# Patient Record
Sex: Male | Born: 1965 | Race: White | Marital: Married | Smoking: Former smoker
Health system: Southern US, Community
[De-identification: ages and names within clinical notes are randomized; demographics above are authoritative.]

## PROBLEM LIST (undated history)

## (undated) DIAGNOSIS — I10 Essential (primary) hypertension: Secondary | ICD-10-CM

## (undated) DIAGNOSIS — K37 Unspecified appendicitis: Secondary | ICD-10-CM

## (undated) DIAGNOSIS — E785 Hyperlipidemia, unspecified: Secondary | ICD-10-CM

## (undated) DIAGNOSIS — E119 Type 2 diabetes mellitus without complications: Secondary | ICD-10-CM

## (undated) HISTORY — PX: VASECTOMY: SHX75

## (undated) HISTORY — DX: Essential (primary) hypertension: I10

## (undated) HISTORY — DX: Type 2 diabetes mellitus without complications: E11.9

## (undated) HISTORY — DX: Unspecified appendicitis: K37

## (undated) HISTORY — DX: Hyperlipidemia, unspecified: E78.5

## (undated) HISTORY — PX: WISDOM TOOTH EXTRACTION: SHX21

---
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
## 2004-10-06 DIAGNOSIS — E119 Type 2 diabetes mellitus without complications: Secondary | ICD-10-CM

## 2004-10-06 HISTORY — DX: Type 2 diabetes mellitus without complications: E11.9

## 2014-04-24 ENCOUNTER — Ambulatory Visit: Payer: BC Managed Care – PPO | Admitting: Neurology

## 2014-11-07 ENCOUNTER — Ambulatory Visit (INDEPENDENT_AMBULATORY_CARE_PROVIDER_SITE_OTHER): Payer: BLUE CROSS/BLUE SHIELD | Admitting: Physician Assistant

## 2014-11-07 ENCOUNTER — Encounter: Payer: Self-pay | Admitting: Physician Assistant

## 2014-11-07 VITALS — BP 160/104 | HR 71 | Temp 97.7°F | Resp 16 | Ht 70.0 in | Wt 202.0 lb

## 2014-11-07 DIAGNOSIS — I10 Essential (primary) hypertension: Secondary | ICD-10-CM

## 2014-11-07 DIAGNOSIS — IMO0002 Reserved for concepts with insufficient information to code with codable children: Secondary | ICD-10-CM

## 2014-11-07 DIAGNOSIS — E1165 Type 2 diabetes mellitus with hyperglycemia: Secondary | ICD-10-CM

## 2014-11-07 LAB — LIPID PANEL
Cholesterol: 198 mg/dL (ref 0–200)
HDL: 37.5 mg/dL — AB (ref >39.00)
NONHDL: 160.5
TRIGLYCERIDES: 335 mg/dL — AB (ref 0.0–149.0)
Total CHOL/HDL Ratio: 5
VLDL: 67 mg/dL — AB (ref 0.0–40.0)

## 2014-11-07 LAB — BASIC METABOLIC PANEL
BUN: 11 mg/dL (ref 6–23)
CHLORIDE: 100 meq/L (ref 96–112)
CO2: 29 mEq/L (ref 19–32)
Calcium: 9.5 mg/dL (ref 8.4–10.5)
Creatinine, Ser: 0.9 mg/dL (ref 0.40–1.50)
GFR: 95.79 mL/min (ref >60.00)
Glucose, Bld: 232 mg/dL — ABNORMAL HIGH (ref 70–99)
Potassium: 4 mEq/L (ref 3.5–5.1)
Sodium: 136 mEq/L (ref 135–145)

## 2014-11-07 LAB — HEMOGLOBIN A1C: Hgb A1c MFr Bld: 9.2 % — ABNORMAL HIGH (ref 4.6–6.5)

## 2014-11-07 LAB — LDL CHOLESTEROL, DIRECT: Direct LDL: 117 mg/dL

## 2014-11-07 MED ORDER — LISINOPRIL 20 MG PO TABS: 20.0000 mg | ORAL_TABLET | Freq: Every day | ORAL | Status: DC

## 2014-11-07 MED ORDER — SITAGLIPTIN PHOSPHATE 100 MG PO TABS: 100.0000 mg | ORAL_TABLET | Freq: Every day | ORAL | Status: DC

## 2014-11-07 MED ORDER — METFORMIN HCL 1000 MG PO TABS: 1000.0000 mg | ORAL_TABLET | Freq: Two times a day (BID) | ORAL | Status: DC

## 2014-11-07 NOTE — Progress Notes (Signed)
Pre visit review using our clinic review tool, if applicable. No additional management support is needed unless otherwise documented below in the visit note/SLS  

## 2014-11-07 NOTE — Patient Instructions (Signed)
Please go to the lab for blood work.  I will call you with your results.  Resume all medications, including the Lisinopril. We will alter your diabetes regimen based on your results.  You will be contacted by the [facility] for an appointment.   Stay active!  Watch those carbs!  Follow-up with me in 3 months.  Diabetes and Foot Care Diabetes may cause you to have problems because of poor blood supply (circulation) to your feet and legs. This may cause the skin on your feet to become thinner, break easier, and heal more slowly. Your skin may become dry, and the skin may peel and crack. You may also have nerve damage in your legs and feet causing decreased feeling in them. You may not notice minor injuries to your feet that could lead to infections or more serious problems. Taking care of your feet is one of the most important things you can do for yourself.  HOME CARE INSTRUCTIONS  Wear shoes at all times, even in the house. Do not go barefoot. Bare feet are easily injured.  Check your feet daily for blisters, cuts, and redness. If you cannot see the bottom of your feet, use a mirror or ask someone for help.  Wash your feet with warm water (do not use hot water) and mild soap. Then pat your feet and the areas between your toes until they are completely dry. Do not soak your feet as this can dry your skin.  Apply a moisturizing lotion or petroleum jelly (that does not contain alcohol and is unscented) to the skin on your feet and to dry, brittle toenails. Do not apply lotion between your toes.  Trim your toenails straight across. Do not dig under them or around the cuticle. File the edges of your nails with an emery board or nail file.  Do not cut corns or calluses or try to remove them with medicine.  Wear clean socks or stockings every day. Make sure they are not too tight. Do not wear knee-high stockings since they may decrease blood flow to your legs.  Wear shoes that fit properly  and have enough cushioning. To break in new shoes, wear them for just a few hours a day. This prevents you from injuring your feet. Always look in your shoes before you put them on to be sure there are no objects inside.  Do not cross your legs. This may decrease the blood flow to your feet.  If you find a minor scrape, cut, or break in the skin on your feet, keep it and the skin around it clean and dry. These areas may be cleansed with mild soap and water. Do not cleanse the area with peroxide, alcohol, or iodine.  When you remove an adhesive bandage, be sure not to damage the skin around it.  If you have a wound, look at it several times a day to make sure it is healing.  Do not use heating pads or hot water bottles. They may burn your skin. If you have lost feeling in your feet or legs, you may not know it is happening until it is too late.  Make sure your health care provider performs a complete foot exam at least annually or more often if you have foot problems. Report any cuts, sores, or bruises to your health care provider immediately. SEEK MEDICAL CARE IF:   You have an injury that is not healing.  You have cuts or breaks in the  skin.  You have an ingrown nail.  You notice redness on your legs or feet.  You feel burning or tingling in your legs or feet.  You have pain or cramps in your legs and feet.  Your legs or feet are numb.  Your feet always feel cold. SEEK IMMEDIATE MEDICAL CARE IF:   There is increasing redness, swelling, or pain in or around a wound.  There is a red line that goes up your leg.  Pus is coming from a wound.  You develop a fever or as directed by your health care provider.  You notice a bad smell coming from an ulcer or wound. Document Released: 09/19/2000 Document Revised: 05/25/2013 Document Reviewed: 03/01/2013 [facility] Patient Information 2015 [place], [place]. This information is not intended to replace advice given to you by your health  care provider. Make sure you discuss any questions you have with your health care provider.

## 2014-11-07 NOTE — Progress Notes (Signed)
Patient presents to clinic today to establish care.  Patient c/o elevated blood sugar levels at home, ranging 160-170.  Is currently on Metformin 1000 mg BID and Januvia 100 mg daily.  Endorses just restarted the Januvia a few days ago.  Denies vision changes, polyuria, polydipsia or polyphagia.  Denies hx of neuropathy.  Is not a smoker. Is due for repeat A1C. Is up-to-date on foot exam. Will be getting simple exam today.  Needs referral to Ophthalmology.  Patient also endorses history of hypertension.  BP in clinic at 160/104. Patient previously well controlled on 20 mg Lisinopril daily.  Has been out of medication for a couple of months. Patient denies chest pain, palpitations, lightheadedness, dizziness, vision changes or frequent headaches.  Endorses need to exercise more.    Past Medical History  Diagnosis Date  . Diabetes 2006  . Hypertension   . Appendicitis     Past Surgical History  Procedure Laterality Date  . Appendectomy  1975  . Wisdom tooth extraction    . Vasectomy      No current outpatient prescriptions on file prior to visit.   No current facility-administered medications on file prior to visit.    Allergies  Allergen Reactions  . Penicillins Anaphylaxis  . Hydrocodone Nausea Only and Rash    Family History  Problem Relation Age of Onset  . Hypertension Mother     Hulda Marin  . Diabetes Mother   . Heart disease Mother   . Arthritis Mother   . Hypertension Father 82    Deceased  . Kidney failure Father   . Diabetes Father   . Heart disease Father   . Diabetes Paternal Grandfather   . Cancer Paternal Grandfather   . Heart disease Other     Grandparents  . Diabetes Paternal Aunt     x2  . Heart disease Other     Paterna.l Aunts & Uncles  . Healthy Brother     x1  . Healthy Son     x1    History   Social History  . Marital Status: Married    Spouse Name: N/A    Number of Children: N/A  . 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 Smoking status: Former Smoker -- 0.10 packs/day for 12 years    Types: Cigarettes  . Smokeless tobacco: Former Neurosurgeon    Types: Chew     Comment: Social Only  . Alcohol Use: 0.0 oz/week    0 Not specified per week     Comment: rare  . Drug Use: No  . Sexual Activity:    Partners: Female   Other Topics Concern  . Not on file   Social History Narrative   1 child -- boy.   ROS Pertinent ROS are listed in the HPI.  BP 160/104 mmHg  Pulse 71  Temp(Src) 97.7 F (36.5 C) (Oral)  Resp 16  Ht  (1.778 m)  Wt 202 lb (91.627 kg)  BMI 28.98 kg/m2  SpO2 100%  Physical Exam  Constitutional: He is oriented to person, place, and time and well-developed, well-nourished, and in no distress.  HENT:  Head: Normocephalic and atraumatic.  Eyes: Conjunctivae are normal.  Cardiovascular: Normal rate, regular rhythm, normal heart sounds and intact distal pulses.   Pulmonary/Chest: Effort normal and breath sounds normal. No respiratory distress. He has no wheezes. He has no rales. He exhibits no tenderness.  Neurological: He is alert and oriented to person, place, and time.  Skin: Skin is warm and dry. No rash noted.  Psychiatric: Affect normal.  Vitals reviewed.  Diabetic Foot Exam - Simple   Simple Foot Form  Diabetic Foot exam was performed with the following findings:  Yes 11/07/2014  1:05 PM  Visual Inspection  No deformities, no ulcerations, no other skin breakdown bilaterally:  Yes  Sensation Testing  Intact to touch and monofilament testing bilaterally:  Yes  Pulse Check  Posterior Tibialis and Dorsalis pulse intact bilaterally:  Yes  Comments     Assessment/Plan: Essential hypertension Will restart Lisinopril 20 mg daily.  HTN is asymptomatic at present.  DASH diet encouraged.  Will follow-up in 1 month to reassess.   Diabetes mellitus type II, uncontrolled Foot exam within normal limits. Will refill medications. Encouraged  increased exercise and limit intake of carbs.  Will check BMP, A1C and lipid panel today.  Will alter regimen based on results.  Continue daily fasting glucose monitoring.

## 2014-11-07 NOTE — Assessment & Plan Note (Signed)
Foot exam within normal limits. Will refill medications. Encouraged increased exercise and limit intake of carbs.  Will check BMP, A1C and lipid panel today.  Will alter regimen based on results.  Continue daily fasting glucose monitoring.

## 2014-11-07 NOTE — Assessment & Plan Note (Signed)
Will restart Lisinopril 20 mg daily.  HTN is asymptomatic at present.  DASH diet encouraged.  Will follow-up in 1 month to reassess.

## 2014-11-08 ENCOUNTER — Telehealth: Payer: Self-pay | Admitting: Physician Assistant

## 2014-11-08 DIAGNOSIS — E781 Pure hyperglyceridemia: Secondary | ICD-10-CM

## 2014-11-08 MED ORDER — FENOFIBRATE 48 MG PO TABS: 48.0000 mg | ORAL_TABLET | Freq: Every day | ORAL | Status: DC

## 2014-11-08 NOTE — Telephone Encounter (Signed)
A1C at 9.2 revealing not under good control.  I would recommend we add on another medication to his regimen.  I would recommend a medication like Invokana at a low dose to begin with.  I know he just restarted his Januvia, so I am willing to let him check his sugar levels over the next two weeks to see if there is improvement.  Our goal is a fasting blood sugar of 80-120 each morning. If sugar levels are not improving, then we really need to start medication.  TGL are significantly elevated in the 300s.  This puts strain on pancreas.  I have called in a prescription for fenofibrate to begin taking daily.   Follow-up in 1 month.

## 2014-11-10 NOTE — Telephone Encounter (Signed)
Patient informed, understood & agreed/SLS  

## 2014-11-15 LAB — HM DIABETES EYE EXAM

## 2014-11-24 ENCOUNTER — Encounter: Payer: Self-pay | Admitting: Physician Assistant

## 2014-12-01 ENCOUNTER — Ambulatory Visit (INDEPENDENT_AMBULATORY_CARE_PROVIDER_SITE_OTHER): Payer: BLUE CROSS/BLUE SHIELD | Admitting: Physician Assistant

## 2014-12-01 ENCOUNTER — Encounter: Payer: Self-pay | Admitting: Physician Assistant

## 2014-12-01 ENCOUNTER — Ambulatory Visit: Payer: Self-pay | Admitting: Physician Assistant

## 2014-12-01 VITALS — BP 166/83 | HR 58 | Resp 16 | Ht 70.0 in | Wt 196.4 lb

## 2014-12-01 DIAGNOSIS — R3129 Other microscopic hematuria: Secondary | ICD-10-CM

## 2014-12-01 DIAGNOSIS — R312 Other microscopic hematuria: Secondary | ICD-10-CM

## 2014-12-01 DIAGNOSIS — R319 Hematuria, unspecified: Secondary | ICD-10-CM

## 2014-12-01 LAB — POCT URINALYSIS DIPSTICK
Bilirubin, UA: NEGATIVE
Glucose, UA: NEGATIVE
KETONES UA: NEGATIVE
Leukocytes, UA: NEGATIVE
Nitrite, UA: NEGATIVE
PROTEIN UA: NEGATIVE
SPEC GRAV UA: 1.025
Urobilinogen, UA: 0.2
pH, UA: 7.5

## 2014-12-01 NOTE — Progress Notes (Signed)
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  Urine dip was positive for hgb.  Patient was told this needed further evaluation.  Denies gross hematuria.  Denies urinary urgency, frequency, nausea/vomiting, suprapubic pressure/pain or flank pain.  Denies testicular/scrotal/penile pain.  Denies rectal pain.  Denies hx of prostatitis.  Past Medical History  Diagnosis Date  . Diabetes 2006  . Hypertension   . Appendicitis     Current Outpatient Prescriptions on File Prior to Visit  Medication Sig Dispense Refill  . fenofibrate (TRICOR) 48 MG tablet Take 1 tablet (48 mg total) by mouth daily. 30 tablet 3  . lisinopril (PRINIVIL,ZESTRIL) 20 MG tablet Take 1 tablet (20 mg total) by mouth daily. 90 tablet 1  . metFORMIN (GLUCOPHAGE) 1000 MG tablet Take 1 tablet (1,000 mg total) by mouth 2 (two) times daily with a meal. 60 tablet 3  . sitaGLIPtin (JANUVIA) 100 MG tablet Take 1 tablet (100 mg total) by mouth daily. 30 tablet 3  . tadalafil (CIALIS) 5 MG tablet Take 5 mg by mouth daily as needed for erectile dysfunction.     No current facility-administered medications on file prior to visit.    Allergies  Allergen Reactions  . Penicillins Anaphylaxis  . Hydrocodone Nausea Only and Rash    Family History  Problem Relation Age of Onset  . Hypertension Mother     Hulda Marin  . Diabetes Mother   . Heart disease Mother   . Arthritis Mother   . Hypertension Father 20    Deceased  . Kidney failure Father   . Diabetes Father   . Heart disease Father   . Diabetes Paternal Grandfather   . Cancer Paternal Grandfather   . Heart disease Other     Grandparents  . Diabetes Paternal Aunt     x2  . Heart disease Other     Paterna.l Aunts & Uncles  . Healthy Brother     x1  . Healthy Son     x1    History   Social History  . Marital Status: Married    Spouse Name: N/A  . Number of Children: N/A  . 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 Smoking status: Former Smoker -- 0.10 packs/day for 12 years    Types: Cigarettes  . Smokeless tobacco: Former Neurosurgeon    Types: Chew     Comment: Social Only  . Alcohol Use: 0.0 oz/week    0 Standard drinks or equivalent per week     Comment: rare  . Drug Use: No  . Sexual Activity:    Partners: Female   Other Topics Concern  . None   Social History Narrative   1 child -- boy.   Review of Systems - See HPI.  All other ROS are negative.  BP 166/83 mmHg  Pulse 58  Resp 16  Ht  (1.778 m)  Wt 196 lb 6 oz (89.075 kg)  BMI 28.18 kg/m2  SpO2 100%  Physical Exam  Constitutional: He is oriented to person, place, and time and well-developed, well-nourished, and in no distress.  HENT:  Head: Normocephalic and atraumatic.  Cardiovascular: Normal rate, regular rhythm, normal heart sounds and intact distal pulses.   Pulmonary/Chest: Effort normal and breath sounds normal. No respiratory distress. He has no wheezes. He has no rales. He exhibits no tenderness.  Abdominal: Soft. Bowel  sounds are normal. He exhibits no distension and no mass. There is no tenderness. There is no rebound and no guarding.  Neurological: He is alert and oriented to person, place, and time.  Skin: Skin is warm and dry. No rash noted.  Psychiatric: Affect normal.  Vitals reviewed.   Recent Results (from the past 2160 hour(s))  Basic Metabolic Panel (BMET)     Status: Abnormal   Collection Time: 11/07/14  8:19 AM  Result Value Ref Range   Sodium 136 135 - 145 mEq/L   Potassium 4.0 3.5 - 5.1 mEq/L   Chloride 100 96 - 112 mEq/L   CO2 29 19 - 32 mEq/L   Glucose, Bld 232 (H) 70 - 99 mg/dL   BUN 11 6 - 23 mg/dL   Creatinine, Ser 5.280.90 0.40 - 1.50 mg/dL   Calcium 9.5 8.4 - 41.310.5 mg/dL   GFR 24.4095.79 >10.27>60.00 mL/min  Lipid Profile     Status: Abnormal   Collection Time: 11/07/14  8:19 AM  Result Value Ref Range   Cholesterol 198 0 -  200 mg/dL    Comment: ATP III Classification       Desirable:  < 200 mg/dL               Borderline High:  200 - 239 mg/dL          High:  > = 253240 mg/dL   Triglycerides 664.4335.0 (H) 0.0 - 149.0 mg/dL    Comment: Normal:  <034<150 mg/dLBorderline High:  150 - 199 mg/dL   HDL 74.2537.50 (L) >95.63>39.00 mg/dL   VLDL 87.567.0 (H) 0.0 - 64.340.0 mg/dL   Total CHOL/HDL Ratio 5     Comment:                Men          Women1/2 Average Risk     3.4          3.3Average Risk          5.0          4.42X Average Risk          9.6          7.13X Average Risk          15.0          11.0                       NonHDL 160.50     Comment: NOTE:  Non-HDL goal should be 30 mg/dL higher than patient's LDL goal (i.e. LDL goal of < 70 mg/dL, would have non-HDL goal of < 100 mg/dL)  Hemoglobin P2RA1c     Status: Abnormal   Collection Time: 11/07/14  8:19 AM  Result Value Ref Range   Hgb A1c MFr Bld 9.2 (H) 4.6 - 6.5 %    Comment: Glycemic Control Guidelines for People with Diabetes:Non Diabetic:  <6%Goal of Therapy: <7%Additional Action Suggested:  >8%   LDL cholesterol, direct     Status: None   Collection Time: 11/07/14  8:19 AM  Result Value Ref Range   Direct LDL 117.0 mg/dL    Comment: Optimal:  <518<100 mg/dLNear or Above Optimal:  100-129 mg/dLBorderline High:  130-159 mg/dLHigh:  160-189 mg/dLVery High:  >190 mg/dL  HM DIABETES EYE EXAM     Status: None   Collection Time: 11/15/14 12:00 AM  Result Value Ref Range   HM Diabetic Eye Exam No Retinopathy No Retinopathy  POCT urinalysis dipstick     Status: None   Collection Time: 12/01/14  9:27 AM  Result Value Ref Range   Color, UA gold    Clarity, UA cloudy    Glucose, UA neg    Bilirubin, UA neg    Ketones, UA neg    Spec Grav, UA 1.025    Blood, UA Moderate    pH, UA 7.5    Protein, UA neg    Urobilinogen, UA 0.2    Nitrite, UA neg    Leukocytes, UA Negative   CULTURE, URINE COMPREHENSIVE     Status: None   Collection Time: 12/01/14  4:59 PM  Result Value Ref Range    Colony Count NO GROWTH    Organism ID, Bacteria NO GROWTH   Urine Microscopic Only     Status: None   Collection Time: 12/01/14  6:28 PM  Result Value Ref Range   Squamous Epithelial / LPF NONE SEEN RARE   Crystals NONE SEEN NONE SEEN   Casts NONE SEEN NONE SEEN   WBC, UA 0-2 <3 WBC/hpf   RBC / HPF 0-2 <3 RBC/hpf   Bacteria, UA NONE SEEN RARE    Assessment/Plan: Microscopic hematuria + Urine dip for hgb.  Will need urine microscopy to further assess.  Patient asymptomatic.  Will check micro to make sure abnormal result is not a false-positive.  Encouraged increase fluids.  Limit heavy lifting, etc at the gym as prolonged high-intensity exercise can sometimes cause small blood in urine.

## 2014-12-01 NOTE — Patient Instructions (Signed)
Please stay well hydrated.  I will call you with your urine results.  It could very well be the intense exercise that is causing this issue.  You are a former smoker, so if your testing shows RBCs in the urine, we will likely proceed with imaging to rule out other causes of your symptoms.  Your blood pressure was a little elevated today in clinic, even with your lisinopril.  It had improved on recheck.  Please continue BP medication as directed.  Check your BP once daily over the next week and give me a call with these measurements.

## 2014-12-01 NOTE — Progress Notes (Signed)
Pre visit review using our clinic review tool, if applicable. No additional management support is needed unless otherwise documented below in the visit note/SLS  

## 2014-12-02 ENCOUNTER — Encounter: Payer: Self-pay | Admitting: Physician Assistant

## 2014-12-02 LAB — URINALYSIS, MICROSCOPIC ONLY
BACTERIA UA: NONE SEEN
Casts: NONE SEEN
Crystals: NONE SEEN
Squamous Epithelial / LPF: NONE SEEN

## 2014-12-03 LAB — CULTURE, URINE COMPREHENSIVE
COLONY COUNT: NO GROWTH
Organism ID, Bacteria: NO GROWTH

## 2014-12-04 ENCOUNTER — Encounter: Payer: Self-pay | Admitting: Physician Assistant

## 2014-12-04 NOTE — Assessment & Plan Note (Signed)
+   Urine dip for hgb.  Will need urine microscopy to further assess.  Patient asymptomatic.  Will check micro to make sure abnormal result is not a false-positive.  Encouraged increase fluids.  Limit heavy lifting, etc at the gym as prolonged high-intensity exercise can sometimes cause small blood in urine.

## 2015-02-01 ENCOUNTER — Encounter: Payer: Self-pay | Admitting: Physician Assistant

## 2015-02-05 ENCOUNTER — Ambulatory Visit (INDEPENDENT_AMBULATORY_CARE_PROVIDER_SITE_OTHER): Payer: BLUE CROSS/BLUE SHIELD | Admitting: Physician Assistant

## 2015-02-05 ENCOUNTER — Telehealth: Payer: Self-pay | Admitting: *Deleted

## 2015-02-05 ENCOUNTER — Encounter: Payer: Self-pay | Admitting: Physician Assistant

## 2015-02-05 VITALS — BP 155/95 | HR 55 | Temp 98.0°F | Wt 198.0 lb

## 2015-02-05 DIAGNOSIS — I10 Essential (primary) hypertension: Secondary | ICD-10-CM | POA: Diagnosis not present

## 2015-02-05 DIAGNOSIS — E1165 Type 2 diabetes mellitus with hyperglycemia: Secondary | ICD-10-CM

## 2015-02-05 DIAGNOSIS — IMO0002 Reserved for concepts with insufficient information to code with codable children: Secondary | ICD-10-CM

## 2015-02-05 DIAGNOSIS — E781 Pure hyperglyceridemia: Secondary | ICD-10-CM

## 2015-02-05 LAB — BASIC METABOLIC PANEL
BUN: 11 mg/dL (ref 6–23)
CHLORIDE: 104 meq/L (ref 96–112)
CO2: 27 mEq/L (ref 19–32)
Calcium: 9.6 mg/dL (ref 8.4–10.5)
Creatinine, Ser: 0.92 mg/dL (ref 0.40–1.50)
GFR: 93.3 mL/min (ref >60.00)
Glucose, Bld: 193 mg/dL — ABNORMAL HIGH (ref 70–99)
POTASSIUM: 4.3 meq/L (ref 3.5–5.1)
SODIUM: 138 meq/L (ref 135–145)

## 2015-02-05 LAB — LIPID PANEL
CHOLESTEROL: 191 mg/dL (ref 0–200)
HDL: 39.5 mg/dL (ref >39.00)
LDL CALC: 116 mg/dL — AB (ref 0–99)
NonHDL: 151.5
Total CHOL/HDL Ratio: 5
Triglycerides: 178 mg/dL — ABNORMAL HIGH (ref 0.0–149.0)
VLDL: 35.6 mg/dL (ref 0.0–40.0)

## 2015-02-05 LAB — HEMOGLOBIN A1C: HEMOGLOBIN A1C: 6.9 % — AB (ref 4.6–6.5)

## 2015-02-05 MED ORDER — TADALAFIL 5 MG PO TABS: 5.0000 mg | ORAL_TABLET | Freq: Every day | ORAL | Status: DC | PRN

## 2015-02-05 MED ORDER — LISINOPRIL-HYDROCHLOROTHIAZIDE 20-12.5 MG PO TABS
1.0000 | ORAL_TABLET | Freq: Every day | ORAL | Status: DC
Start: 2015-02-05 — End: 2015-08-21

## 2015-02-05 MED ORDER — FENOFIBRATE 48 MG PO TABS: 48.0000 mg | ORAL_TABLET | Freq: Every day | ORAL | Status: DC

## 2015-02-05 NOTE — Progress Notes (Signed)
Pre visit review using our clinic review tool, if applicable. No additional management support is needed unless otherwise documented below in the visit note. 

## 2015-02-05 NOTE — Assessment & Plan Note (Signed)
Above goal on lisinopril 20 mg.  Will begin Lisinopril-HCTZ 20-12.5 mg daily. DASH diet encouraged.  Will check BMP and Lipid today.

## 2015-02-05 NOTE — Progress Notes (Signed)
Patient presents to clinic today for follow-up of Hypertension and Diabetes Mellitus II.   Hypertension -- Endorses taking lisinopril as directed. Patient denies chest pain, palpitations, lightheadedness, dizziness, vision changes or frequent headaches.  Diabetes Mellitus II, uncomplicated, uncontrolled -- Endorses taking Metformin and Januvia as directed. Is trying to watch diet.  Endorses fasting CBGs averaging 125-130. Endorses good urinary output.  Denies change in vision.  Denies altered sensation in hands or feet.  Past Medical History  Diagnosis Date  . Diabetes 2006  . Hypertension   . Appendicitis     Current Outpatient Prescriptions on File Prior to Visit  Medication Sig Dispense Refill  . fenofibrate (TRICOR) 48 MG tablet Take 1 tablet (48 mg total) by mouth daily. 30 tablet 3  . metFORMIN (GLUCOPHAGE) 1000 MG tablet Take 1 tablet (1,000 mg total) by mouth 2 (two) times daily with a meal. 60 tablet 3  . sitaGLIPtin (JANUVIA) 100 MG tablet Take 1 tablet (100 mg total) by mouth daily. 30 tablet 3  . tadalafil (CIALIS) 5 MG tablet Take 5 mg by mouth daily as needed for erectile dysfunction.     No current facility-administered medications on file prior to visit.    Allergies  Allergen Reactions  . Penicillins Anaphylaxis  . Hydrocodone Nausea Only and Rash    Family History  Problem Relation Age of Onset  . Hypertension Mother     Hulda Marin  . Diabetes Mother   . Heart disease Mother   . Arthritis Mother   . Hypertension Father 14    Deceased  . Kidney failure Father   . Diabetes Father   . Heart disease Father   . Diabetes Paternal Grandfather   . Cancer Paternal Grandfather   . Heart disease Other     Grandparents  . Diabetes Paternal Aunt     x2  . Heart disease Other     Paterna.l Aunts & Uncles  . Healthy Brother     x1  . Healthy Son     x1    History   Social History  . Marital Status: Married    Spouse Name: N/A  . Number of Children: N/A    . 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 Smoking status: Former Smoker -- 0.10 packs/day for 12 years    Types: Cigarettes  . Smokeless tobacco: Former Neurosurgeon    Types: Chew     Comment: Social Only  . Alcohol Use: 0.0 oz/week    0 Standard drinks or equivalent per week     Comment: rare  . Drug Use: No  . Sexual Activity:    Partners: Female   Other Topics Concern  . None   Social History Narrative   1 child -- boy.   Review of Systems - See HPI.  All other ROS are negative.  BP 155/95 mmHg  Pulse 55  Temp(Src) 98 F (36.7 C)  Wt 198 lb (89.812 kg)  SpO2 100%  Physical Exam  Constitutional: He is oriented to person, place, and time and well-developed, well-nourished, and in no distress.  HENT:  Head: Normocephalic and atraumatic.  Cardiovascular: Normal rate, regular rhythm, normal heart sounds and intact distal pulses.   Pulmonary/Chest: Effort normal and breath sounds normal. No respiratory distress. He has no wheezes. He has no rales. He exhibits no tenderness.  Neurological: He is alert and oriented to person, place, and time.  Skin:  Skin is warm and dry. No rash noted.  Psychiatric: Affect normal.  Vitals reviewed.   Recent Results (from the past 2160 hour(s))  Basic Metabolic Panel (BMET)     Status: Abnormal   Collection Time: 11/07/14  8:19 AM  Result Value Ref Range   Sodium 136 135 - 145 mEq/L   Potassium 4.0 3.5 - 5.1 mEq/L   Chloride 100 96 - 112 mEq/L   CO2 29 19 - 32 mEq/L   Glucose, Bld 232 (H) 70 - 99 mg/dL   BUN 11 6 - 23 mg/dL   Creatinine, Ser 1.61 0.40 - 1.50 mg/dL   Calcium 9.5 8.4 - 09.6 mg/dL   GFR 04.54 >09.81 mL/min  Lipid Profile     Status: Abnormal   Collection Time: 11/07/14  8:19 AM  Result Value Ref Range   Cholesterol 198 0 - 200 mg/dL    Comment: ATP III Classification       Desirable:  < 200 mg/dL               Borderline High:  200 - 239 mg/dL          High:  > =  191 mg/dL   Triglycerides 478.2 (H) 0.0 - 149.0 mg/dL    Comment: Normal:  <956 mg/dLBorderline High:  150 - 199 mg/dL   HDL 21.30 (L) >86.57 mg/dL   VLDL 84.6 (H) 0.0 - 96.2 mg/dL   Total CHOL/HDL Ratio 5     Comment:                Men          Women1/2 Average Risk     3.4          3.3Average Risk          5.0          4.42X Average Risk          9.6          7.13X Average Risk          15.0          11.0                       NonHDL 160.50     Comment: NOTE:  Non-HDL goal should be 30 mg/dL higher than patient's LDL goal (i.e. LDL goal of < 70 mg/dL, would have non-HDL goal of < 100 mg/dL)  Hemoglobin X5M     Status: Abnormal   Collection Time: 11/07/14  8:19 AM  Result Value Ref Range   Hgb A1c MFr Bld 9.2 (H) 4.6 - 6.5 %    Comment: Glycemic Control Guidelines for People with Diabetes:Non Diabetic:  <6%Goal of Therapy: <7%Additional Action Suggested:  >8%   LDL cholesterol, direct     Status: None   Collection Time: 11/07/14  8:19 AM  Result Value Ref Range   Direct LDL 117.0 mg/dL    Comment: Optimal:  <841 mg/dLNear or Above Optimal:  100-129 mg/dLBorderline High:  130-159 mg/dLHigh:  160-189 mg/dLVery High:  >190 mg/dL  HM DIABETES EYE EXAM     Status: None   Collection Time: 11/15/14 12:00 AM  Result Value Ref Range   HM Diabetic Eye Exam No Retinopathy No Retinopathy  POCT urinalysis dipstick     Status: None   Collection Time: 12/01/14  9:27 AM  Result Value Ref Range   Color, UA gold    Clarity, UA cloudy  Glucose, UA neg    Bilirubin, UA neg    Ketones, UA neg    Spec Grav, UA 1.025    Blood, UA Moderate    pH, UA 7.5    Protein, UA neg    Urobilinogen, UA 0.2    Nitrite, UA neg    Leukocytes, UA Negative   CULTURE, URINE COMPREHENSIVE     Status: None   Collection Time: 12/01/14  4:59 PM  Result Value Ref Range   Colony Count NO GROWTH    Organism ID, Bacteria NO GROWTH   Urine Microscopic Only     Status: None   Collection Time: 12/01/14  6:28 PM  Result  Value Ref Range   Squamous Epithelial / LPF NONE SEEN RARE   Crystals NONE SEEN NONE SEEN   Casts NONE SEEN NONE SEEN   WBC, UA 0-2 <3 WBC/hpf   RBC / HPF 0-2 <3 RBC/hpf   Bacteria, UA NONE SEEN RARE    Assessment/Plan: Diabetes mellitus type II, uncontrolled Will repeat A1C and BMP today.  Patient to speak with insurance regarding coverage for pneumonia vaccine.  DFE up-to-date. Follow-up will be based on A1C result.   Essential hypertension Above goal on lisinopril 20 mg.  Will begin Lisinopril-HCTZ 20-12.5 mg daily. DASH diet encouraged.  Will check BMP and Lipid today.

## 2015-02-05 NOTE — Telephone Encounter (Signed)
Pt requesting refill on his cialis 5mg  & fenofibrate 48mg  . Okay to refill?

## 2015-02-05 NOTE — Patient Instructions (Signed)
Please go to the lab for blood work. I will call you with your results. Please continue medications as directed, stopping the lisinopril and adding the new lisinopril-HCTZ tablet daily.  Stay active and watch your salt intake.  DASH Eating Plan DASH stands for "Dietary Approaches to Stop Hypertension." The DASH eating plan is a healthy eating plan that has been shown to reduce high blood pressure (hypertension). Additional health benefits may include reducing the risk of type 2 diabetes mellitus, heart disease, and stroke. The DASH eating plan may also help with weight loss. WHAT DO I NEED TO KNOW ABOUT THE DASH EATING PLAN? For the DASH eating plan, you will follow these general guidelines:  Choose foods with a percent daily value for sodium of less than 5% (as listed on the food label).  Use salt-free seasonings or herbs instead of table salt or sea salt.  Check with your health care provider or pharmacist before using salt substitutes.  Eat lower-sodium products, often labeled as "lower sodium" or "no salt added."  Eat fresh foods.  Eat more vegetables, fruits, and low-fat dairy products.  Choose whole grains. Look for the word "whole" as the first word in the ingredient list.  Choose fish and skinless chicken or [place] more often than red meat. Limit fish, poultry, and meat to 6 oz (170 g) each day.  Limit sweets, desserts, sugars, and sugary drinks.  Choose heart-healthy fats.  Limit cheese to 1 oz (28 g) per day.  Eat more home-cooked food and less restaurant, buffet, and fast food.  Limit fried foods.  Cook foods using methods other than frying.  Limit canned vegetables. If you do use them, rinse them well to decrease the sodium.  When eating at a restaurant, ask that your food be prepared with less salt, or no salt if possible. WHAT FOODS CAN I EAT? Seek help from a dietitian for individual calorie needs. Grains Whole grain or whole wheat bread. Brown rice. Whole  grain or whole wheat pasta. Quinoa, bulgur, and whole grain cereals. Low-sodium cereals. Corn or whole wheat flour tortillas. Whole grain cornbread. Whole grain crackers. Low-sodium crackers. Vegetables Fresh or frozen vegetables (raw, steamed, roasted, or grilled). Low-sodium or reduced-sodium tomato and vegetable juices. Low-sodium or reduced-sodium tomato sauce and paste. Low-sodium or reduced-sodium canned vegetables.  Fruits All fresh, canned (in natural juice), or frozen fruits. Meat and Other Protein Products Ground beef (85% or leaner), grass-fed beef, or beef trimmed of fat. Skinless chicken or [place]. Ground chicken or [place]. Pork trimmed of fat. All fish and seafood. Eggs. Dried beans, peas, or lentils. Unsalted nuts and seeds. Unsalted canned beans. Dairy Low-fat dairy products, such as skim or 1% milk, 2% or reduced-fat cheeses, low-fat ricotta or cottage cheese, or plain low-fat yogurt. Low-sodium or reduced-sodium cheeses. Fats and Oils Tub margarines without trans fats. Light or reduced-fat mayonnaise and salad dressings (reduced sodium). Avocado. Safflower, olive, or canola oils. Natural peanut or almond butter. Other Unsalted popcorn and pretzels. The items listed above may not be a complete list of recommended foods or beverages. Contact your dietitian for more options. WHAT FOODS ARE NOT RECOMMENDED? Grains White bread. White pasta. White rice. Refined cornbread. Bagels and croissants. Crackers that contain trans fat. Vegetables Creamed or fried vegetables. Vegetables in a cheese sauce. Regular canned vegetables. Regular canned tomato sauce and paste. Regular tomato and vegetable juices. Fruits Dried fruits. Canned fruit in light or heavy syrup. Fruit juice. Meat and Other Protein Products Fatty cuts of meat.  Ribs, chicken wings, bacon, sausage, bologna, salami, chitterlings, fatback, hot dogs, bratwurst, and packaged luncheon meats. Salted nuts and seeds. Canned beans with  salt. Dairy Whole or 2% milk, cream, half-and-half, and cream cheese. Whole-fat or sweetened yogurt. Full-fat cheeses or blue cheese. Nondairy creamers and whipped toppings. Processed cheese, cheese spreads, or cheese curds. Condiments Onion and garlic salt, seasoned salt, table salt, and sea salt. Canned and packaged gravies. Worcestershire sauce. Tartar sauce. Barbecue sauce. Teriyaki sauce. Soy sauce, including reduced sodium. Steak sauce. Fish sauce. Oyster sauce. Cocktail sauce. Horseradish. Ketchup and mustard. Meat flavorings and tenderizers. Bouillon cubes. Hot sauce. Tabasco sauce. Marinades. Taco seasonings. Relishes. Fats and Oils Butter, stick margarine, lard, shortening, ghee, and bacon fat. Coconut, palm kernel, or palm oils. Regular salad dressings. Other Pickles and olives. Salted popcorn and pretzels. The items listed above may not be a complete list of foods and beverages to avoid. Contact your dietitian for more information. WHERE CAN I FIND MORE INFORMATION? National Heart, Lung, and Blood Institute: CablePromo.itwww.nhlbi.nih.gov/health/health-topics/topics/dash/ Document Released: 09/11/2011 Document Revised: 02/06/2014 Document Reviewed: 07/27/2013 [facility] Patient Information 2015 [place], [place]. This information is not intended to replace advice given to you by your health care provider. Make sure you discuss any questions you have with your health care provider.

## 2015-02-05 NOTE — Telephone Encounter (Signed)
Ok to refill 

## 2015-02-05 NOTE — Assessment & Plan Note (Signed)
Will repeat A1C and BMP today.  Patient to speak with insurance regarding coverage for pneumonia vaccine.  DFE up-to-date. Follow-up will be based on A1C result.

## 2015-03-20 ENCOUNTER — Ambulatory Visit (INDEPENDENT_AMBULATORY_CARE_PROVIDER_SITE_OTHER): Payer: BLUE CROSS/BLUE SHIELD | Admitting: Physician Assistant

## 2015-03-20 ENCOUNTER — Encounter: Payer: Self-pay | Admitting: Physician Assistant

## 2015-03-20 ENCOUNTER — Ambulatory Visit (HOSPITAL_BASED_OUTPATIENT_CLINIC_OR_DEPARTMENT_OTHER)
Admission: RE | Admit: 2015-03-20 | Discharge: 2015-03-20 | Disposition: A | Discharge status: Home / Self Care | Payer: BLUE CROSS/BLUE SHIELD | Source: Ambulatory Visit | Attending: Physician Assistant | Admitting: Physician Assistant

## 2015-03-20 VITALS — BP 149/81 | HR 58 | Temp 98.3°F | Resp 16 | Ht 70.0 in | Wt 199.2 lb

## 2015-03-20 DIAGNOSIS — R110 Nausea: Secondary | ICD-10-CM | POA: Diagnosis not present

## 2015-03-20 DIAGNOSIS — R399 Unspecified symptoms and signs involving the genitourinary system: Secondary | ICD-10-CM

## 2015-03-20 DIAGNOSIS — N10 Acute tubulo-interstitial nephritis: Secondary | ICD-10-CM

## 2015-03-20 DIAGNOSIS — R109 Unspecified abdominal pain: Secondary | ICD-10-CM

## 2015-03-20 LAB — POCT URINALYSIS DIPSTICK
BILIRUBIN UA: NEGATIVE
Bilirubin, UA: NEGATIVE
Glucose, UA: NEGATIVE
KETONES UA: NEGATIVE
Ketones, UA: NEGATIVE
LEUKOCYTES UA: NEGATIVE
LEUKOCYTES UA: NEGATIVE
NITRITE UA: NEGATIVE
Nitrite, UA: NEGATIVE
Protein, UA: POSITIVE
Spec Grav, UA: >=1.03
UROBILINOGEN UA: 0.2
Urobilinogen, UA: 0.2
pH, UA: 5
pH, UA: 5

## 2015-03-20 LAB — CBC WITH DIFFERENTIAL/PLATELET
BASOS ABS: 0 10*3/uL (ref 0.0–0.1)
Basophils Relative: 0.4 % (ref 0.0–3.0)
Eosinophils Absolute: 0.2 10*3/uL (ref 0.0–0.7)
Eosinophils Relative: 2.4 % (ref 0.0–5.0)
HEMATOCRIT: 37.3 % — AB (ref 39.0–52.0)
Hemoglobin: 12.9 g/dL — ABNORMAL LOW (ref 13.0–17.0)
LYMPHS ABS: 1.6 10*3/uL (ref 0.7–4.0)
Lymphocytes Relative: 21.9 % (ref 12.0–46.0)
MCHC: 34.5 g/dL (ref 30.0–36.0)
MCV: 83.1 fl (ref 78.0–100.0)
MONO ABS: 0.3 10*3/uL (ref 0.1–1.0)
Monocytes Relative: 4.5 % (ref 3.0–12.0)
NEUTROS ABS: 5.2 10*3/uL (ref 1.4–7.7)
Neutrophils Relative %: 70.8 % (ref 43.0–77.0)
PLATELETS: 201 10*3/uL (ref 150.0–400.0)
RBC: 4.5 Mil/uL (ref 4.22–5.81)
RDW: 13.4 % (ref 11.5–15.5)
WBC: 7.4 10*3/uL (ref 4.0–10.5)

## 2015-03-20 LAB — BASIC METABOLIC PANEL
BUN: 14 mg/dL (ref 6–23)
CALCIUM: 9.2 mg/dL (ref 8.4–10.5)
CO2: 26 mEq/L (ref 19–32)
CREATININE: 1.13 mg/dL (ref 0.40–1.50)
Chloride: 105 mEq/L (ref 96–112)
GFR: 73.55 mL/min (ref >60.00)
Glucose, Bld: 222 mg/dL — ABNORMAL HIGH (ref 70–99)
Potassium: 4.1 mEq/L (ref 3.5–5.1)
Sodium: 138 mEq/L (ref 135–145)

## 2015-03-20 LAB — LIPASE: Lipase: 65 U/L — ABNORMAL HIGH (ref 11.0–59.0)

## 2015-03-20 MED ORDER — TRAMADOL HCL 50 MG PO TABS: 50.0000 mg | ORAL_TABLET | Freq: Three times a day (TID) | ORAL | Status: DC | PRN

## 2015-03-20 MED ORDER — CIPROFLOXACIN HCL 500 MG PO TABS: 500.0000 mg | ORAL_TABLET | Freq: Two times a day (BID) | ORAL | Status: DC

## 2015-03-20 MED ORDER — KETOROLAC TROMETHAMINE 60 MG/2ML IM SOLN: 60.0000 mg | Freq: Once | INTRAMUSCULAR | Status: DC

## 2015-03-20 MED ORDER — KETOROLAC TROMETHAMINE 60 MG/2ML IM SOLN
60.0000 mg | Freq: Once | INTRAMUSCULAR | Status: AC
  Administered 2015-03-20: 60 mg via INTRAMUSCULAR

## 2015-03-20 MED ORDER — ONDANSETRON HCL 4 MG/2ML IJ SOLN
4.0000 mg | Freq: Once | INTRAMUSCULAR | Status: AC
  Administered 2015-03-20: 4 mg via INTRAMUSCULAR

## 2015-03-20 MED ORDER — ONDANSETRON HCL 4 MG/2ML IJ SOLN: 4.0000 mg | Freq: Once | INTRAMUSCULAR | Status: DC

## 2015-03-20 MED ORDER — TAMSULOSIN HCL 0.4 MG PO CAPS: 0.4000 mg | ORAL_CAPSULE | Freq: Every day | ORAL | Status: DC

## 2015-03-20 NOTE — Progress Notes (Signed)
Pre visit review using our clinic review tool, if applicable. No additional management support is needed unless otherwise documented below in the visit note/SLS  

## 2015-03-20 NOTE — Addendum Note (Signed)
Addended by: Regis Bill on: 03/20/2015 12:09 PM   Modules accepted: Orders

## 2015-03-20 NOTE — Assessment & Plan Note (Signed)
Concern for Pyelonephritis versus Nephrolithiasis.  Urine dip with + blood but no LE or nitrites. KUB obtained and negative for stone but CT recommended. Urine sent for culture. Will obtain STAT CBC, BMP, Lipase.  Rx Cipro BID until culture results.  Rx Flomax to help flow. Tramadol RX given for pain. Im toradol given in office by nurse with improvement in pain. Alarm signs/symptoms reviewed with patient. Will proceed with further management based on lab results.

## 2015-03-20 NOTE — Progress Notes (Signed)
Patient presents to clinic today c/o 1 week of dysuria, urinary urgency and incomplete bladder emptying and R flank pain and severe nausea starting this AM.  Patient endorses currently sexually active but with wife only. No concern for STIs.  Denies hx of kidney stone.  Denies hematuria.  Past Medical History  Diagnosis Date  . Diabetes 2006  . Hypertension   . Appendicitis     Current Outpatient Prescriptions on File Prior to Visit  Medication Sig Dispense Refill  . fenofibrate (TRICOR) 48 MG tablet Take 1 tablet (48 mg total) by mouth daily. 30 tablet 6  . lisinopril-hydrochlorothiazide (ZESTORETIC) 20-12.5 MG per tablet Take 1 tablet by mouth daily. 30 tablet 3  . metFORMIN (GLUCOPHAGE) 1000 MG tablet Take 1 tablet (1,000 mg total) by mouth 2 (two) times daily with a meal. 60 tablet 3  . sitaGLIPtin (JANUVIA) 100 MG tablet Take 1 tablet (100 mg total) by mouth daily. 30 tablet 3  . tadalafil (CIALIS) 5 MG tablet Take 1 tablet (5 mg total) by mouth daily as needed for erectile dysfunction. 12 tablet 0   No current facility-administered medications on file prior to visit.    Allergies  Allergen Reactions  . Penicillins Anaphylaxis  . Hydrocodone Nausea Only and Rash    Family History  Problem Relation Age of Onset  . Hypertension Mother     Hulda Marin  . Diabetes Mother   . Heart disease Mother   . Arthritis Mother   . Hypertension Father 63    Deceased  . Kidney failure Father   . Diabetes Father   . Heart disease Father   . Diabetes Paternal Grandfather   . Cancer Paternal Grandfather   . Heart disease Other     Grandparents  . Diabetes Paternal Aunt     x2  . Heart disease Other     Paterna.l Aunts & Uncles  . Healthy Brother     x1  . Healthy Son     x1    History   Social History  . Marital Status: Married    Spouse Name: N/A  . Number of Children: N/A  . 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 Smoking status: Former Smoker -- 0.10 packs/day for 12 years    Types: Cigarettes  . Smokeless tobacco: Former Neurosurgeon    Types: Chew     Comment: Social Only  . Alcohol Use: 0.0 oz/week    0 Standard drinks or equivalent per week     Comment: rare  . Drug Use: No  . Sexual Activity:    Partners: Female   Other Topics Concern  . None   Social History Narrative   1 child -- boy.   Review of Systems - See HPI.  All other ROS are negative.  BP 149/81 mmHg  Pulse 58  Resp 16  Ht 5\' 10"  (1.778 m)  Wt 199 lb 4 oz (90.379 kg)  BMI 28.59 kg/m2  SpO2 100%  Physical Exam  Constitutional: He is oriented to person, place, and time and well-developed, well-nourished, and in no distress.  HENT:  Head: Normocephalic and atraumatic.  Eyes: Conjunctivae are normal.  Neck: Neck supple.  Cardiovascular: Normal rate, regular rhythm, normal heart sounds and intact distal pulses.   Pulmonary/Chest: Effort normal and breath sounds normal. No respiratory distress. He has no wheezes. He has no rales. He exhibits no tenderness.  Abdominal:  Normal appearance and bowel sounds are normal. There is tenderness in the suprapubic area. There is CVA tenderness. No hernia.  Neurological: He is alert and oriented to person, place, and time.  Skin: Skin is warm and dry. No rash noted.  Psychiatric: Affect normal.  Vitals reviewed.   Recent Results (from the past 2160 hour(s))  Basic Metabolic Panel (BMET)     Status: Abnormal   Collection Time: 02/05/15  7:48 AM  Result Value Ref Range   Sodium 138 135 - 145 mEq/L   Potassium 4.3 3.5 - 5.1 mEq/L   Chloride 104 96 - 112 mEq/L   CO2 27 19 - 32 mEq/L   Glucose, Bld 193 (H) 70 - 99 mg/dL   BUN 11 6 - 23 mg/dL   Creatinine, Ser 1.61 0.40 - 1.50 mg/dL   Calcium 9.6 8.4 - 09.6 mg/dL   GFR 04.54 >09.81 mL/min  Hemoglobin A1c     Status: Abnormal   Collection Time: 02/05/15  7:48 AM  Result Value Ref Range   Hgb A1c MFr Bld 6.9 (H) 4.6 - 6.5 %     Comment: Glycemic Control Guidelines for People with Diabetes:Non Diabetic:  <6%Goal of Therapy: <7%Additional Action Suggested:  >8%   Lipid panel     Status: Abnormal   Collection Time: 02/05/15  7:48 AM  Result Value Ref Range   Cholesterol 191 0 - 200 mg/dL    Comment: ATP III Classification       Desirable:  < 200 mg/dL               Borderline High:  200 - 239 mg/dL          High:  > = 191 mg/dL   Triglycerides 478.2 (H) 0.0 - 149.0 mg/dL    Comment: Normal:  <956 mg/dLBorderline High:  150 - 199 mg/dL   HDL 21.30 >86.57 mg/dL   VLDL 84.6 0.0 - 96.2 mg/dL   LDL Cholesterol 952 (H) 0 - 99 mg/dL   Total CHOL/HDL Ratio 5     Comment:                Men          Women1/2 Average Risk     3.4          3.3Average Risk          5.0          4.42X Average Risk          9.6          7.13X Average Risk          15.0          11.0                       NonHDL 151.50     Comment: NOTE:  Non-HDL goal should be 30 mg/dL higher than patient's LDL goal (i.e. LDL goal of < 70 mg/dL, would have non-HDL goal of < 100 mg/dL)  POCT urinalysis dipstick     Status: None   Collection Time: 03/20/15  9:14 AM  Result Value Ref Range   Color, UA yellow    Clarity, UA clear    Glucose, UA trace    Bilirubin, UA neg    Ketones, UA neg    Spec Grav, UA >=1.030    Blood, UA small    pH, UA 5.0    Protein, UA small    Urobilinogen,  UA 0.2    Nitrite, UA neg    Leukocytes, UA Negative Negative    Assessment/Plan: Right flank pain Concern for Pyelonephritis versus Nephrolithiasis.  Urine dip with + blood but no LE or nitrites. KUB obtained and negative for stone but CT recommended. Urine sent for culture. Will obtain STAT CBC, BMP, Lipase.  Rx Cipro BID until culture results.  Rx Flomax to help flow. Tramadol RX given for pain. Im toradol given in office by nurse with improvement in pain. Alarm signs/symptoms reviewed with patient. Will proceed with further management based on lab results.

## 2015-03-20 NOTE — Patient Instructions (Signed)
I will call you with your lab results. Remember to go downstairs for x-ray.  I will call you with those results. Start the Cipro taking as directed.  Stay well hydrated and rest. Start Flomax tonight at bedtime to increase urinary flow. The Tramadol is for breakthrough pain. If symptoms acutely worsen or you develop fever, please go to the ER.

## 2015-03-20 NOTE — Addendum Note (Signed)
Addended by: Marcelline Mates on: 03/20/2015 10:34 AM   Modules accepted: Kipp Brood

## 2015-03-21 ENCOUNTER — Telehealth: Payer: Self-pay | Admitting: *Deleted

## 2015-03-21 LAB — CULTURE, URINE COMPREHENSIVE
COLONY COUNT: NO GROWTH
ORGANISM ID, BACTERIA: NO GROWTH

## 2015-03-21 NOTE — Telephone Encounter (Signed)
Already taken care of today. Faxed personally

## 2015-03-21 NOTE — Telephone Encounter (Signed)
Medication Detail      Disp Refills Start End     tadalafil (CIALIS) 5 MG tablet 12 tablet 0 02/05/2015     Sig - Route: Take 1 tablet (5 mg total) by mouth daily as needed for erectile dysfunction. - Oral    E-Prescribing Status: Receipt confirmed by pharmacy (02/05/2015 4:42 PM EDT)     Pharmacy    [facility] - [place], [place] - [address]   Fax request for Cialis refill/SLS

## 2015-05-09 ENCOUNTER — Telehealth: Payer: Self-pay | Admitting: Physician Assistant

## 2015-05-09 DIAGNOSIS — E1165 Type 2 diabetes mellitus with hyperglycemia: Secondary | ICD-10-CM

## 2015-05-09 DIAGNOSIS — IMO0002 Reserved for concepts with insufficient information to code with codable children: Secondary | ICD-10-CM

## 2015-05-09 MED ORDER — METFORMIN HCL 1000 MG PO TABS: 1000.0000 mg | ORAL_TABLET | Freq: Two times a day (BID) | ORAL | Status: DC

## 2015-05-09 MED ORDER — SITAGLIPTIN PHOSPHATE 100 MG PO TABS: 100.0000 mg | ORAL_TABLET | Freq: Every day | ORAL | Status: DC

## 2015-05-09 NOTE — Telephone Encounter (Signed)
Ok to fill same sig. Quant 12 with 3 refills.

## 2015-05-09 NOTE — Telephone Encounter (Signed)
Can be reached: [phone] Pharmacy:[facility] - [place], [place] - [address]  Reason for call: Pt called for refills on metformin (out of meds), cialis (out of meds but not urgent), and januvia (has a few days left)

## 2015-05-09 NOTE — Telephone Encounter (Signed)
Metformin and Januvia filled.  Please advise on Cialis.    Last filled: 02/05/15 Amt: 12, 0 Last OV:  03/20/15

## 2015-05-10 MED ORDER — TADALAFIL 5 MG PO TABS: 5.0000 mg | ORAL_TABLET | Freq: Every day | ORAL | Status: DC | PRN

## 2015-05-10 NOTE — Telephone Encounter (Signed)
Rx sent 

## 2015-06-28 ENCOUNTER — Other Ambulatory Visit: Payer: Self-pay | Admitting: Physician Assistant

## 2015-06-28 NOTE — Telephone Encounter (Signed)
Medication was changed to lisinopril-hydrochlorothiazide.

## 2015-08-21 ENCOUNTER — Other Ambulatory Visit: Payer: Self-pay | Admitting: Physician Assistant

## 2015-09-05 ENCOUNTER — Telehealth: Payer: Self-pay | Admitting: Physician Assistant

## 2015-09-05 DIAGNOSIS — IMO0001 Reserved for inherently not codable concepts without codable children: Secondary | ICD-10-CM

## 2015-09-05 DIAGNOSIS — E1165 Type 2 diabetes mellitus with hyperglycemia: Principal | ICD-10-CM

## 2015-09-05 MED ORDER — METFORMIN HCL 1000 MG PO TABS: 1000.0000 mg | ORAL_TABLET | Freq: Two times a day (BID) | ORAL | Status: DC

## 2015-09-05 MED ORDER — LISINOPRIL-HYDROCHLOROTHIAZIDE 20-12.5 MG PO TABS: 1.0000 | ORAL_TABLET | Freq: Every day | ORAL | Status: DC

## 2015-09-05 NOTE — Telephone Encounter (Signed)
Caller name: Self   Can be reached: [phone]  [facility] [place] [place] - [address] [phone] (Phone) [phone] (Fax)         Reason for call: Request refills on lisinopril-hydrochlorothiazide (PRINZIDE,ZESTORETIC) 20-12.5 MG tablet [[id number]][[id number]] and metFORMIN (GLUCOPHAGE) 1000 MG tablet [[id number]][[id number]]

## 2015-09-05 NOTE — Telephone Encounter (Signed)
One month supply sent in of each. He is overdue for follow-up (Diabetes) and needs to schedule appointment for this.

## 2015-09-05 NOTE — Telephone Encounter (Signed)
Patient scheduled appointment for 10-10-2015 via MyChart for follow up

## 2015-10-10 ENCOUNTER — Ambulatory Visit: Payer: BLUE CROSS/BLUE SHIELD | Admitting: Physician Assistant

## 2015-11-16 ENCOUNTER — Ambulatory Visit: Payer: BLUE CROSS/BLUE SHIELD | Admitting: Physician Assistant

## 2015-11-20 ENCOUNTER — Ambulatory Visit: Payer: Self-pay | Admitting: Physician Assistant

## 2015-12-04 ENCOUNTER — Encounter: Payer: Self-pay | Admitting: Physician Assistant

## 2015-12-04 ENCOUNTER — Ambulatory Visit (INDEPENDENT_AMBULATORY_CARE_PROVIDER_SITE_OTHER): Payer: BLUE CROSS/BLUE SHIELD | Admitting: Physician Assistant

## 2015-12-04 VITALS — BP 138/88 | HR 51 | Temp 97.7°F | Resp 16 | Ht 70.0 in | Wt 194.0 lb

## 2015-12-04 DIAGNOSIS — E1165 Type 2 diabetes mellitus with hyperglycemia: Secondary | ICD-10-CM | POA: Diagnosis not present

## 2015-12-04 DIAGNOSIS — IMO0001 Reserved for inherently not codable concepts without codable children: Secondary | ICD-10-CM

## 2015-12-04 LAB — LIPID PANEL
CHOLESTEROL: 177 mg/dL (ref 0–200)
HDL: 41.3 mg/dL (ref >39.00)
LDL Cholesterol: 108 mg/dL — ABNORMAL HIGH (ref 0–99)
NonHDL: 135.55
TRIGLYCERIDES: 137 mg/dL (ref 0.0–149.0)
Total CHOL/HDL Ratio: 4
VLDL: 27.4 mg/dL (ref 0.0–40.0)

## 2015-12-04 LAB — BASIC METABOLIC PANEL
BUN: 9 mg/dL (ref 6–23)
CALCIUM: 9.6 mg/dL (ref 8.4–10.5)
CHLORIDE: 104 meq/L (ref 96–112)
CO2: 30 mEq/L (ref 19–32)
CREATININE: 0.86 mg/dL (ref 0.40–1.50)
GFR: 100.5 mL/min (ref >60.00)
Glucose, Bld: 137 mg/dL — ABNORMAL HIGH (ref 70–99)
Potassium: 4.2 mEq/L (ref 3.5–5.1)
SODIUM: 141 meq/L (ref 135–145)

## 2015-12-04 LAB — HEMOGLOBIN A1C: HEMOGLOBIN A1C: 8.2 % — AB (ref 4.6–6.5)

## 2015-12-04 MED ORDER — METFORMIN HCL 1000 MG PO TABS: 1000.0000 mg | ORAL_TABLET | Freq: Two times a day (BID) | ORAL | Status: DC

## 2015-12-04 MED ORDER — SITAGLIPTIN PHOSPHATE 100 MG PO TABS: 100.0000 mg | ORAL_TABLET | Freq: Every day | ORAL | Status: DC

## 2015-12-04 NOTE — Progress Notes (Signed)
Patient presents to clinic today for follow-up of DM II, previously uncontrolled. Is taking Metformin 100 mg BID and Januvia 100 mg daily. Endorses fasting sugars averaging around 130-150 over the past few weeks. Is working on diet and exercise to further improve sugars. Has started using the MyFitnessPal app.  Patient declines flu shot. Is due for Pneumovax but wishes to defer to next appointment.   Past Medical History  Diagnosis Date  . Diabetes (HCC) 2006  . Hypertension   . Appendicitis     Current Outpatient Prescriptions on File Prior to Visit  Medication Sig Dispense Refill  . lisinopril-hydrochlorothiazide (PRINZIDE,ZESTORETIC) 20-12.5 MG tablet Take 1 tablet by mouth daily. 30 tablet 0  . tadalafil (CIALIS) 5 MG tablet Take 1 tablet (5 mg total) by mouth daily as needed for erectile dysfunction. 12 tablet 3  . fenofibrate (TRICOR) 48 MG tablet Take 1 tablet (48 mg total) by mouth daily. (Patient not taking: Reported on 12/04/2015) 30 tablet 6   No current facility-administered medications on file prior to visit.    Allergies  Allergen Reactions  . Penicillins Anaphylaxis  . Hydrocodone Nausea Only and Rash    Family History  Problem Relation Age of Onset  . Hypertension Mother     Hulda Marin  . Diabetes Mother   . Heart disease Mother   . Arthritis Mother   . Hypertension Father 3    Deceased  . Kidney failure Father   . Diabetes Father   . Heart disease Father   . Diabetes Paternal Grandfather   . Cancer Paternal Grandfather   . Heart disease Other     Grandparents  . Diabetes Paternal Aunt     x2  . Heart disease Other     Paterna.l Aunts & Uncles  . Healthy Brother     x1  . Healthy Son     x1    Social History   Social History  . Marital Status: Married    Spouse Name: N/A  . Number of Children: N/A  . 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 Smoking status: Former Smoker -- 0.10  packs/day for 12 years    Types: Cigarettes  . Smokeless tobacco: Former Neurosurgeon    Types: Chew     Comment: Social Only  . Alcohol Use: 0.0 oz/week    0 Standard drinks or equivalent per week     Comment: rare  . Drug Use: No  . Sexual Activity:    Partners: Female   Other Topics Concern  . None   Social History Narrative   1 child -- boy.   Review of Systems - See HPI.  All other ROS are negative.  BP 138/88 mmHg  Pulse 51  Temp(Src) 97.7 F (36.5 C) (Oral)  Resp 16  Ht  (1.778 m)  Wt 194 lb (87.998 kg)  BMI 27.84 kg/m2  SpO2 99%  Physical Exam  Constitutional: He is oriented to person, place, and time and well-developed, well-nourished, and in no distress.  HENT:  Head: Normocephalic and atraumatic.  Eyes: Conjunctivae are normal.  Neck: Neck supple.  Cardiovascular: Normal rate, regular rhythm, normal heart sounds and intact distal pulses.   Pulmonary/Chest: Effort normal and breath sounds normal. No respiratory distress. He has no wheezes. He has no rales. He exhibits no tenderness.  Neurological: He is alert and oriented to person, place, and time.  Skin: Skin is  warm and dry. No rash noted.  Psychiatric: Affect normal.  Vitals reviewed.  Diabetic Foot Form - Detailed   Diabetic Foot Exam - detailed  Diabetic Foot exam was performed with the following findings:  Yes 12/04/2015  8:45 AM  Visual Foot Exam completed.:  Yes  Is there a history of foot ulcer?:  No  Can the patient see the bottom of their feet?:  Yes  Are the shoes appropriate in style and fit?:  Yes  Is there swelling or and abnormal foot shape?:  No  Are the toenails long?:  No  Are the toenails thick?:  No  Do you have pain in calf while walking?:  No  Is there a claw toe deformity?:  No  Is there elevated skin temparature?:  No  Is there limited skin dorsiflexion?:  No  Is there foot or ankle muscle weakness?:  No  Are the toenails ingrown?:  No  Normal Range of Motion:  Yes    Pulse  Foot Exam completed.:  Yes  Right posterior Tibialias:  Present Left posterior Tibialias:  Present  Right Dorsalis Pedis:  Present Left Dorsalis Pedis:  Present  Sensory Foot Exam Completed.:  Yes  Swelling:  No  Semmes-Weinstein Monofilament Test  R Foot Test Control:  Neg L Foot Test Control:  Neg  R Site 1-Great Toe:  Neg L Site 1-Great Toe:  Neg  R Site 4:  Neg L Site 4:  Neg  R Site 5:  Neg L Site 5:  Neg        Recent Results (from the past 2160 hour(s))  Basic Metabolic Panel (BMET)     Status: Abnormal   Collection Time: 12/04/15  9:22 AM  Result Value Ref Range   Sodium 141 135 - 145 mEq/L   Potassium 4.2 3.5 - 5.1 mEq/L   Chloride 104 96 - 112 mEq/L   CO2 30 19 - 32 mEq/L   Glucose, Bld 137 (H) 70 - 99 mg/dL   BUN 9 6 - 23 mg/dL   Creatinine, Ser 1.61 0.40 - 1.50 mg/dL   Calcium 9.6 8.4 - 09.6 mg/dL   GFR 045.40 >98.11 mL/min  Hemoglobin A1c     Status: Abnormal   Collection Time: 12/04/15  9:22 AM  Result Value Ref Range   Hgb A1c MFr Bld 8.2 (H) 4.6 - 6.5 %    Comment: Glycemic Control Guidelines for People with Diabetes:Non Diabetic:  <6%Goal of Therapy: <7%Additional Action Suggested:  >8%   Lipid Profile     Status: Abnormal   Collection Time: 12/04/15  9:22 AM  Result Value Ref Range   Cholesterol 177 0 - 200 mg/dL    Comment: ATP III Classification       Desirable:  < 200 mg/dL               Borderline High:  200 - 239 mg/dL          High:  > = 914 mg/dL   Triglycerides 782.9 0.0 - 149.0 mg/dL    Comment: Normal:  <562 mg/dLBorderline High:  150 - 199 mg/dL   HDL 13.08 >65.78 mg/dL   VLDL 46.9 0.0 - 62.9 mg/dL   LDL Cholesterol 528 (H) 0 - 99 mg/dL   Total CHOL/HDL Ratio 4     Comment:                Men          Women1/2 Average Risk  3.4          3.3Average Risk          5.0          4.42X Average Risk          9.6          7.13X Average Risk          15.0          11.0                       NonHDL 135.55     Comment: NOTE:  Non-HDL goal should be 30  mg/dL higher than patient's LDL goal (i.e. LDL goal of < 70 mg/dL, would have non-HDL goal of < 100 mg/dL)    Assessment/Plan: Diabetes mellitus type II, uncontrolled Foot exam updated and unremarkable. Declines flu shot and defers pneumonia shot to next visit. Will check repeat labs today. Continue Metformin 1000 mg BID and Januvia 100 mg daily. Will refill medications. Will alter regimen if indicated by results

## 2015-12-04 NOTE — Assessment & Plan Note (Signed)
Foot exam updated and unremarkable. Declines flu shot and defers pneumonia shot to next visit. Will check repeat labs today. Continue Metformin 1000 mg BID and Januvia 100 mg daily. Will refill medications. Will alter regimen if indicated by results

## 2015-12-04 NOTE — Patient Instructions (Signed)
Please go to the lab for blood work. I will call you with your results. Please continue medications as directed. We will alter regimen based on lab results.  Follow-up will be based on results but I would recommend you schedule a complete physical at your earliest convenience.

## 2015-12-04 NOTE — Progress Notes (Signed)
Pre visit review using our clinic review tool, if applicable. No additional management support is needed unless otherwise documented below in the visit note/SLS  

## 2015-12-10 ENCOUNTER — Other Ambulatory Visit: Payer: Self-pay | Admitting: Physician Assistant

## 2015-12-10 DIAGNOSIS — E1165 Type 2 diabetes mellitus with hyperglycemia: Principal | ICD-10-CM

## 2015-12-10 DIAGNOSIS — IMO0001 Reserved for inherently not codable concepts without codable children: Secondary | ICD-10-CM

## 2015-12-10 MED ORDER — TADALAFIL 5 MG PO TABS: 5.0000 mg | ORAL_TABLET | Freq: Every day | ORAL | Status: DC | PRN

## 2015-12-10 MED ORDER — LISINOPRIL-HYDROCHLOROTHIAZIDE 20-12.5 MG PO TABS: 1.0000 | ORAL_TABLET | Freq: Every day | ORAL | Status: DC

## 2015-12-10 MED ORDER — SITAGLIPTIN PHOSPHATE 100 MG PO TABS: 100.0000 mg | ORAL_TABLET | Freq: Every day | ORAL | Status: DC

## 2015-12-10 MED ORDER — METFORMIN HCL 1000 MG PO TABS: 1000.0000 mg | ORAL_TABLET | Freq: Two times a day (BID) | ORAL | Status: DC

## 2015-12-10 NOTE — Telephone Encounter (Signed)
Pt is requesting a refill on his medications. 1.JANUVIA 2. Lisinopril   3.CIALIS  4.MetFORMIN   Pharamcy : [facility] - [place], [place] - [address]  CB: [phone]

## 2015-12-10 NOTE — Telephone Encounter (Signed)
Refills sent

## 2016-03-07 ENCOUNTER — Ambulatory Visit (INDEPENDENT_AMBULATORY_CARE_PROVIDER_SITE_OTHER): Payer: BLUE CROSS/BLUE SHIELD | Admitting: Physician Assistant

## 2016-03-07 ENCOUNTER — Encounter: Payer: Self-pay | Admitting: Physician Assistant

## 2016-03-07 VITALS — BP 142/90 | HR 64 | Temp 97.6°F | Resp 16 | Ht 70.0 in | Wt 192.4 lb

## 2016-03-07 DIAGNOSIS — E1142 Type 2 diabetes mellitus with diabetic polyneuropathy: Secondary | ICD-10-CM | POA: Diagnosis not present

## 2016-03-07 DIAGNOSIS — IMO0001 Reserved for inherently not codable concepts without codable children: Secondary | ICD-10-CM

## 2016-03-07 DIAGNOSIS — E1165 Type 2 diabetes mellitus with hyperglycemia: Secondary | ICD-10-CM

## 2016-03-07 DIAGNOSIS — M79673 Pain in unspecified foot: Secondary | ICD-10-CM

## 2016-03-07 LAB — BASIC METABOLIC PANEL
BUN: 16 mg/dL (ref 6–23)
CALCIUM: 9.4 mg/dL (ref 8.4–10.5)
CO2: 26 mEq/L (ref 19–32)
Chloride: 101 mEq/L (ref 96–112)
Creatinine, Ser: 0.89 mg/dL (ref 0.40–1.50)
GFR: 96.5 mL/min (ref >60.00)
GLUCOSE: 181 mg/dL — AB (ref 70–99)
POTASSIUM: 3.8 meq/L (ref 3.5–5.1)
SODIUM: 138 meq/L (ref 135–145)

## 2016-03-07 LAB — HEMOGLOBIN A1C: Hgb A1c MFr Bld: 7.8 % — ABNORMAL HIGH (ref 4.6–6.5)

## 2016-03-07 MED ORDER — GABAPENTIN 100 MG PO CAPS: ORAL_CAPSULE | ORAL | Status: DC

## 2016-03-07 NOTE — Progress Notes (Signed)
Patient presents to clinic today c/o 2 weeks of pain in bilateral feet with sensation of swelling and numbness in the center plantar surface. Endorses symptoms started after wearing new composite toe work boots. Endorses pain was severe, worse with plantar flexion. Is now improving since he changed out his work shoes. In regards to the numbness, he describes this as more of a tingling sensation. Is occurring intermittently. Denies color or temperature change in feet. Patient with history of Diabetes II, previously uncontrolled with A1C at 8.2 Is currently on a regimen of Metformin 1000 mg BID and Januvia 100 mg QD. Endorses fasting sugars averaging 160 in the morning.   Past Medical History  Diagnosis Date  . Diabetes (HCC) 2006  . Hypertension   . Appendicitis     Current Outpatient Prescriptions on File Prior to Visit  Medication Sig Dispense Refill  . lisinopril-hydrochlorothiazide (PRINZIDE,ZESTORETIC) 20-12.5 MG tablet Take 1 tablet by mouth daily. 30 tablet 5  . metFORMIN (GLUCOPHAGE) 1000 MG tablet Take 1 tablet (1,000 mg total) by mouth 2 (two) times daily with a meal. 60 tablet 5  . sitaGLIPtin (JANUVIA) 100 MG tablet Take 1 tablet (100 mg total) by mouth daily. 30 tablet 5  . tadalafil (CIALIS) 5 MG tablet Take 1 tablet (5 mg total) by mouth daily as needed for erectile dysfunction. 12 tablet 3  . fenofibrate (TRICOR) 48 MG tablet Take 1 tablet (48 mg total) by mouth daily. (Patient not taking: Reported on 12/04/2015) 30 tablet 6   No current facility-administered medications on file prior to visit.    Allergies  Allergen Reactions  . Penicillins Anaphylaxis  . Hydrocodone Nausea Only and Rash    Family History  Problem Relation Age of Onset  . Hypertension Mother     Hulda MarinLiivng  . Diabetes Mother   . Heart disease Mother   . Arthritis Mother   . Hypertension Father [id number]    Deceased  . Kidney failure Father   . Diabetes Father   . Heart disease Father   . Diabetes  Paternal Grandfather   . Cancer Paternal Grandfather   . Heart disease Other     Grandparents  . Diabetes Paternal Aunt     x2  . Heart disease Other     Paterna.l Aunts & Uncles  . Healthy Brother     x1  . Healthy Son     x1    Social History   Social History  . Marital Status: Married    Spouse Name: N/A  . Number of Children: N/A  . 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 Smoking status: Former Smoker -- 0.10 packs/day for 12 years    Types: Cigarettes  . Smokeless tobacco: Former NeurosurgeonUser    Types: Chew     Comment: Social Only  . Alcohol Use: 0.0 oz/week    0 Standard drinks or equivalent per week     Comment: rare  . Drug Use: No  . Sexual Activity:    Partners: Female   Other Topics Concern  . None   Social History Narrative   1 child -- boy.   Review of Systems - See HPI.  All other ROS are negative.  BP 142/90 mmHg  Pulse 64  Temp(Src) 97.6 F (36.4 C) (Oral)  Resp 16  Ht 5\' 10"  (1.778 m)  Wt 192 lb 6 oz (87.261 kg)  BMI 27.60 kg/m2  SpO2 99%  Physical Exam  Constitutional: He is oriented to person, place, and time and well-developed, well-nourished, and in no distress.  HENT:  Head: Normocephalic and atraumatic.  Eyes: Conjunctivae are normal.  Cardiovascular: Normal rate, regular rhythm, normal heart sounds and intact distal pulses.   Pulmonary/Chest: Effort normal and breath sounds normal. No respiratory distress. He has no wheezes. He has no rales. He exhibits no tenderness.  Neurological: He is alert and oriented to person, place, and time.  Skin: Skin is warm and dry.  Vitals reviewed.  Diabetic Foot Form - Detailed   Diabetic Foot Exam - detailed  Diabetic Foot exam was performed with the following findings:  Yes 03/07/2016  7:52 AM  Visual Foot Exam completed.:  Yes  Is there a history of foot ulcer?:  No  Can the patient see the bottom of their feet?:  Yes  Are the shoes  appropriate in style and fit?:  Yes  Is there swelling or and abnormal foot shape?:  No  Are the toenails long?:  Yes  Are the toenails thick?:  No  Do you have pain in calf while walking?:  No  Is there a claw toe deformity?:  No  Is there elevated skin temparature?:  No  Is there limited skin dorsiflexion?:  No  Is there foot or ankle muscle weakness?:  No  Are the toenails ingrown?:  No  Normal Range of Motion:  Yes    Pulse Foot Exam completed.:  Yes  Right posterior Tibialias:  Present Left posterior Tibialias:  Present  Right Dorsalis Pedis:  Present Left Dorsalis Pedis:  Present  Sensory Foot Exam Completed.:  Yes  Swelling:  No  Semmes-Weinstein Monofilament Test    Comments:  Monofilament testing performed. Absent sensation or Great toes bilaterally. Otherwise sensation is intact     Assessment/Plan: Diabetes mellitus type II, uncontrolled Will check A1C and BMP today. Foot exam reveals decreased sensation of great toes bilaterally. Symptoms of neuropathy present. Will start B complex vitamin and Gabapentin. Continue Metformin and Januvia. Exercise changes discussed. Diet reviewed.  Diabetic polyneuropathy associated with type 2 diabetes mellitus (HCC) Will begin Gabapentin 100 mg and titrate from there.  Start B complex vitamin. FU 3-4 weeks.  Foot pain R worse than L. Combination of mild neuropathy and muscle strain. Supportive measures and OTC meds reviewed. RX Gabapentin initiated.

## 2016-03-07 NOTE — Progress Notes (Signed)
Pre visit review using our clinic review tool, if applicable. No additional management support is needed unless otherwise documented below in the visit note/SLS  

## 2016-03-07 NOTE — Assessment & Plan Note (Signed)
R worse than L. Combination of mild neuropathy and muscle strain. Supportive measures and OTC meds reviewed. RX Gabapentin initiated.

## 2016-03-07 NOTE — Patient Instructions (Signed)
Please go to the lab for blood work. I will call with results. Keep up with diabetes medications. We may need to tweak these based on lab results. Start the Gabapentin as directed. Make sure to wear supportive footwear. Rest those feet/abkles for a couple weeks (No mud runs!). Do the cold can exercises as directed. Tylenol for pain.   Follow-up 3-4 weeks.

## 2016-03-07 NOTE — Assessment & Plan Note (Signed)
Will begin Gabapentin 100 mg and titrate from there.  Start B complex vitamin. FU 3-4 weeks.

## 2016-03-07 NOTE — Assessment & Plan Note (Signed)
Will check A1C and BMP today. Foot exam reveals decreased sensation of great toes bilaterally. Symptoms of neuropathy present. Will start B complex vitamin and Gabapentin. Continue Metformin and Januvia. Exercise changes discussed. Diet reviewed.

## 2016-03-10 IMAGING — DX DG ABDOMEN 1V
3 series · 3 of 3 positions shown · non-contrast
Comparison: None.

CLINICAL DATA: 49-year-old male with flank pain greater on the
right x1 week. Query right nephrolithiasis. Initial encounter.

EXAM:
ABDOMEN - 1 VIEW

[abdomen kub (1 of 3)]
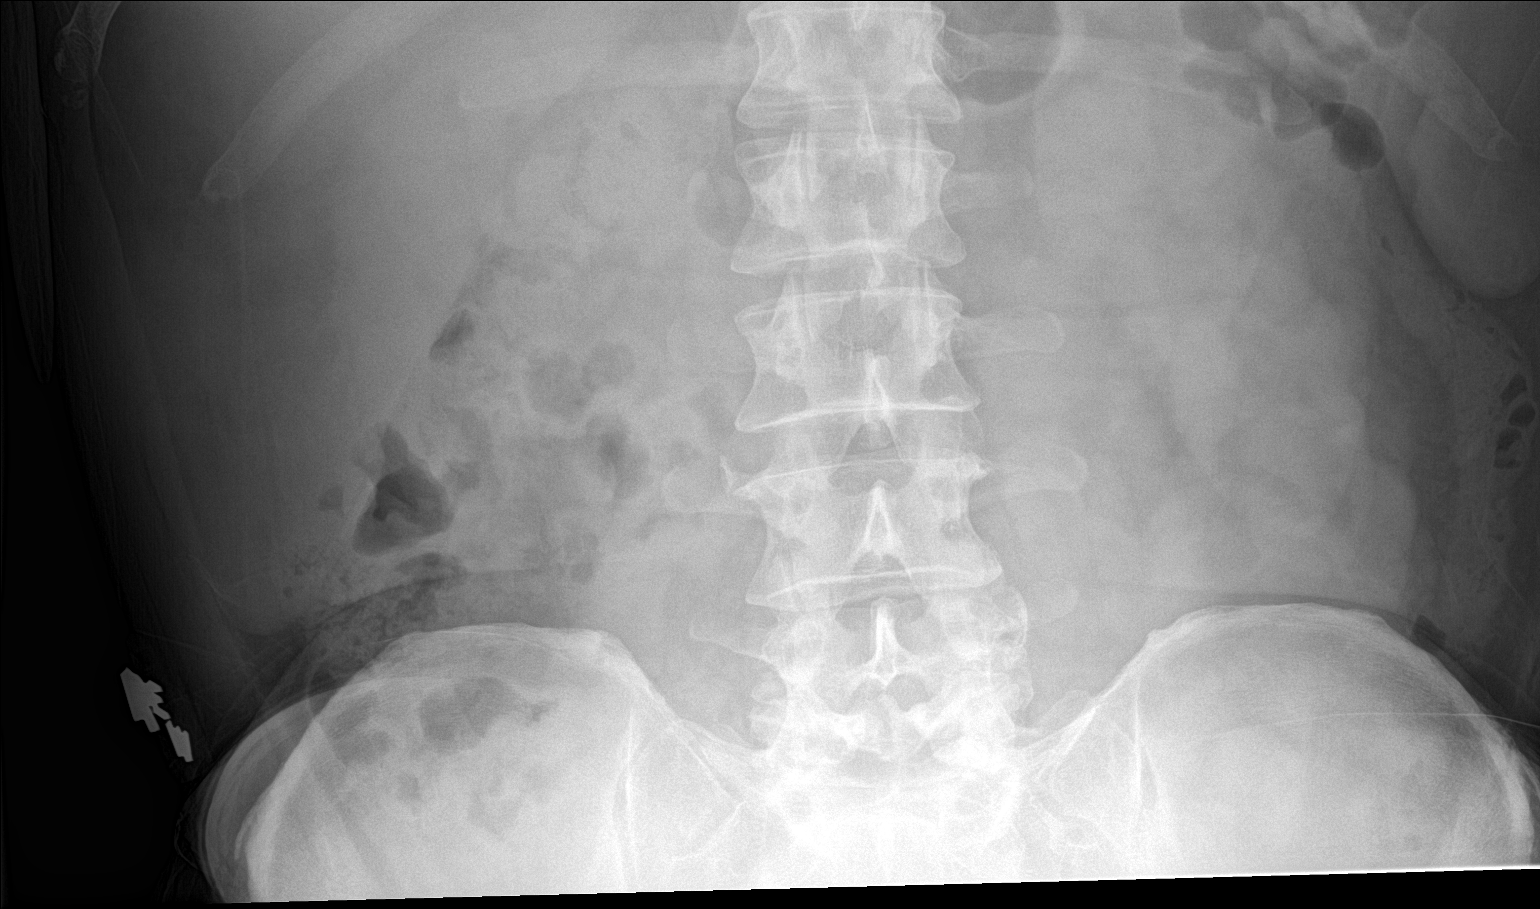

[abdomen kub (2 of 3)]
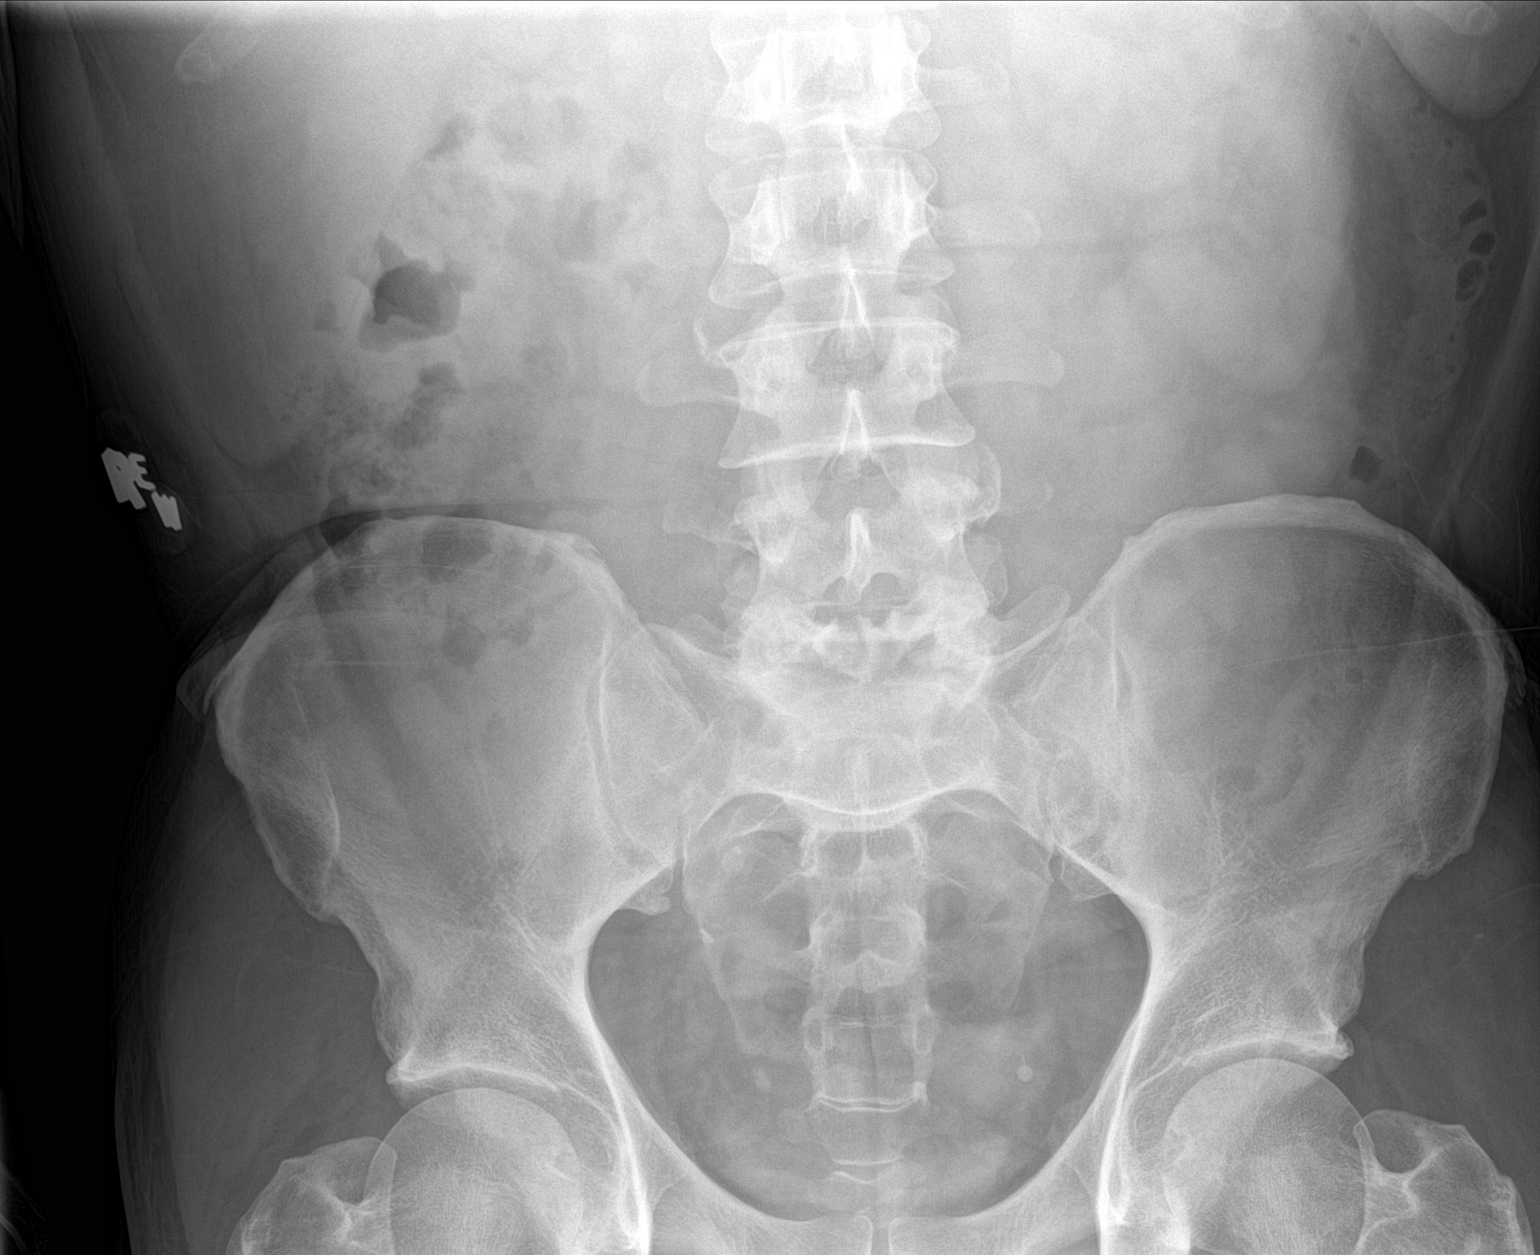

[abdomen kub (3 of 3)]
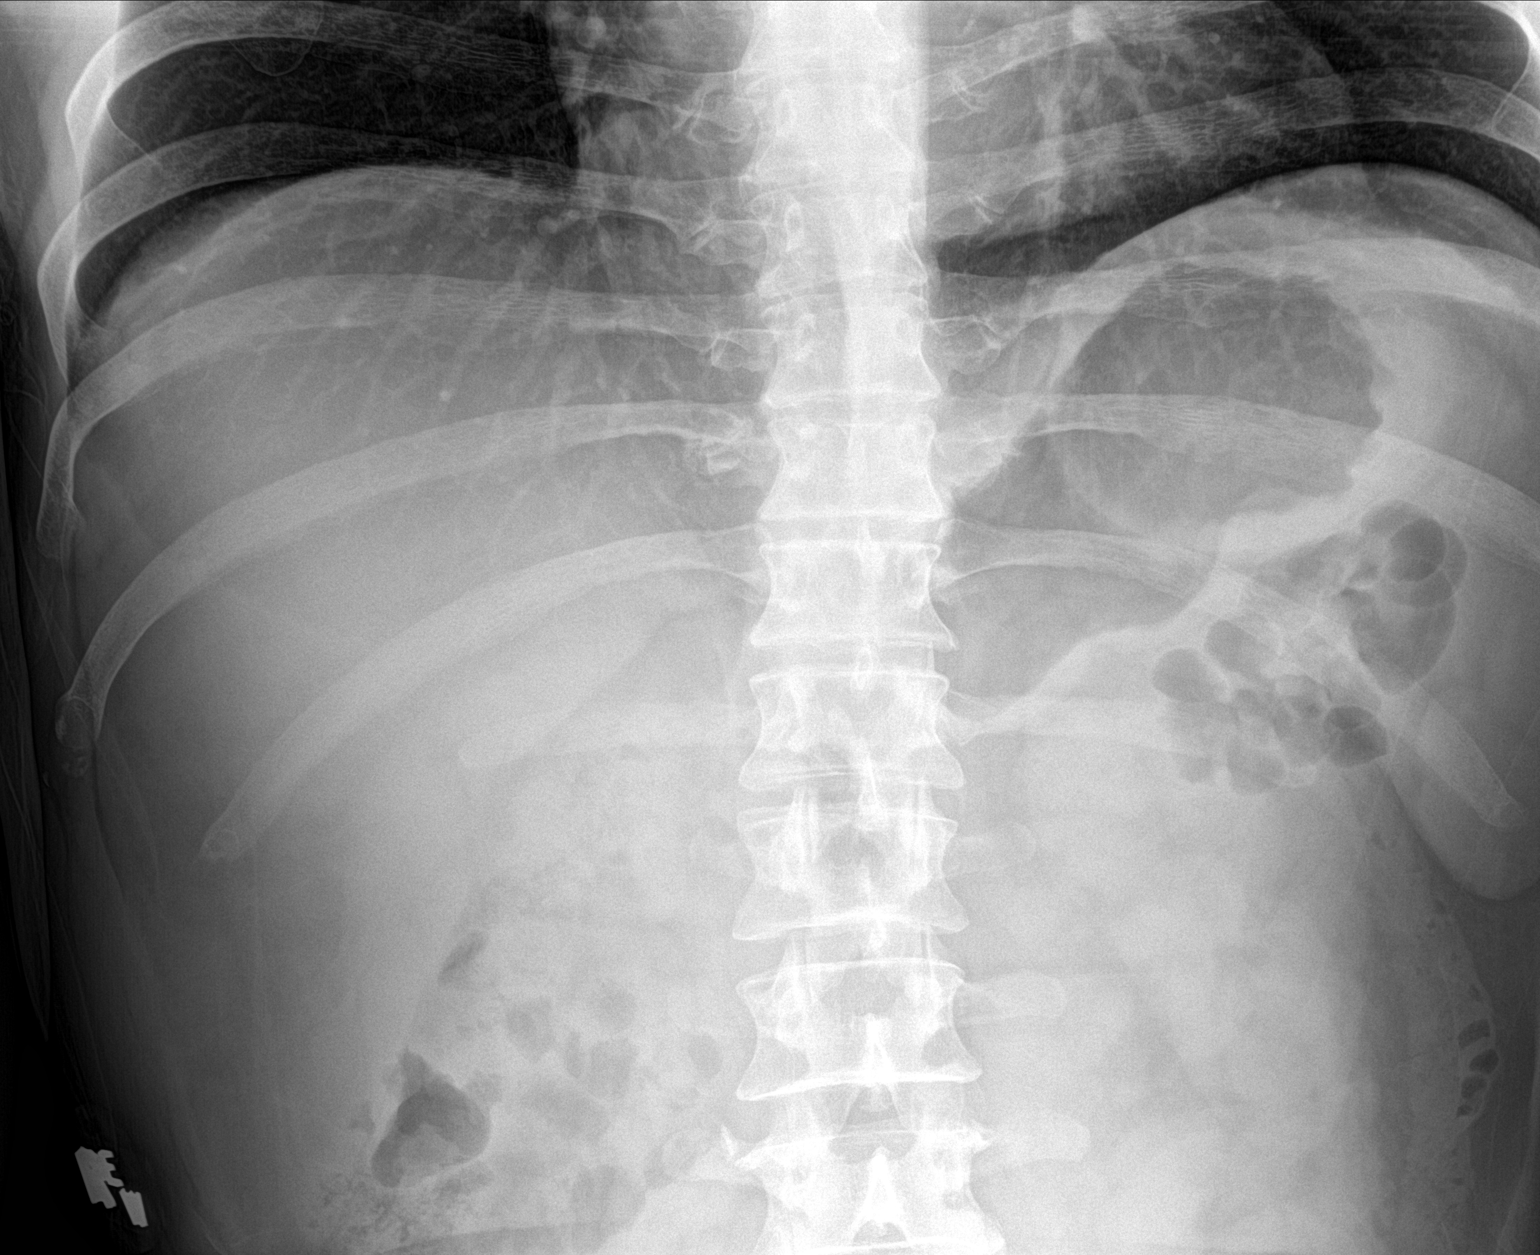

[3 of 3 positions shown; findings below may reference images not displayed]

FINDINGS: KUB views of the abdomen and pelvis. No nephrolithiasis identified
radiographically. There are 3 calcifications in the right pelvis
measuring up to a 6 mm individually. There is a single small
calcification in the left hemipelvis.

Negative lung bases. Non obstructed bowel gas pattern. Abdominal and
pelvic visceral contours are within normal limits. No acute osseous
abnormality identified.
IMPRESSION: No nephrolithiasis is identified, but a distal right ureteral
calculus is difficult to exclude.

If hematuria is detected recommend noncontrast CT Abdomen and
Pelvis.

## 2016-03-11 ENCOUNTER — Encounter: Payer: Self-pay | Admitting: Physician Assistant

## 2016-03-11 DIAGNOSIS — IMO0001 Reserved for inherently not codable concepts without codable children: Secondary | ICD-10-CM

## 2016-03-11 DIAGNOSIS — E1165 Type 2 diabetes mellitus with hyperglycemia: Principal | ICD-10-CM

## 2016-03-25 ENCOUNTER — Ambulatory Visit (INDEPENDENT_AMBULATORY_CARE_PROVIDER_SITE_OTHER): Payer: BLUE CROSS/BLUE SHIELD

## 2016-03-25 ENCOUNTER — Ambulatory Visit (INDEPENDENT_AMBULATORY_CARE_PROVIDER_SITE_OTHER): Payer: BLUE CROSS/BLUE SHIELD | Admitting: Podiatry

## 2016-03-25 ENCOUNTER — Encounter: Payer: Self-pay | Admitting: Podiatry

## 2016-03-25 VITALS — BP 154/88 | HR 72 | Resp 16

## 2016-03-25 DIAGNOSIS — E119 Type 2 diabetes mellitus without complications: Secondary | ICD-10-CM

## 2016-03-25 DIAGNOSIS — M722 Plantar fascial fibromatosis: Secondary | ICD-10-CM

## 2016-03-25 DIAGNOSIS — E1142 Type 2 diabetes mellitus with diabetic polyneuropathy: Secondary | ICD-10-CM

## 2016-03-25 MED ORDER — MELOXICAM 15 MG PO TABS: 15.0000 mg | ORAL_TABLET | Freq: Every day | ORAL | Status: DC

## 2016-03-25 NOTE — Patient Instructions (Signed)

## 2016-03-25 NOTE — Progress Notes (Signed)
   Subjective:    Patient ID: Raymond Sandoval, male    DOB: 09/01/1966, 50 y.o.   MRN: [id number]  HPI: He presents today with a six-month history of pain to the forefoot bilateral. He also relates some pain along the lateral foot and the heel. He states my feet just hurt in general he states it hurts all to my knees. 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 He saw his primary provider who suggested this was diabetic peripheral neuropathy and was provided gabapentin. He states that he took one gabapentin and decided not to take anymore. Relates that his latest hemoglobin A1c reading is a 7.8.    Review of Systems  All other systems reviewed and are negative.      Objective:   Physical Exam: Vital signs are stable he is alert and oriented 3 pulses are palpable. Neurologic sensorium is intact deep tendon reflexes are intact muscle strength is normal bilateral. Orthopedic evaluation of the strips all joints distal to the ankle for range of motion without crepitation. He has pain on palpation medial calcaneal tubercles of the bilateral heel as well as lateral aspect of the consistent with compensatory lateral syndrome. He also has an increase in soft tissue density at the plantar fascia pain insertion site bilateral heels on radiographs taken today in the office. Cutaneous evaluation does not go straight any open lesions or wounds.        Assessment & Plan:  Plantar fasciitis with some diabetic peripheral neuropathy bilateral.  Plan: Placed him in braces after bilateral plantar fascia injections today. I also started him on an anti-inflammatory meloxicam 15 mg 1 by mouth daily plantar fascial braces were dispensed as well as a night splint and I will follow-up with him in 1 month and determine as to whether or not we need orthotics. We may need to consider epidermal nerve fiber testing.

## 2016-03-27 ENCOUNTER — Encounter: Payer: Self-pay | Admitting: Physician Assistant

## 2016-05-01 ENCOUNTER — Ambulatory Visit (INDEPENDENT_AMBULATORY_CARE_PROVIDER_SITE_OTHER): Payer: BLUE CROSS/BLUE SHIELD | Admitting: Podiatry

## 2016-05-01 ENCOUNTER — Encounter: Payer: Self-pay | Admitting: Podiatry

## 2016-05-01 DIAGNOSIS — E1142 Type 2 diabetes mellitus with diabetic polyneuropathy: Secondary | ICD-10-CM | POA: Diagnosis not present

## 2016-05-01 DIAGNOSIS — M722 Plantar fascial fibromatosis: Secondary | ICD-10-CM | POA: Diagnosis not present

## 2016-05-01 NOTE — Progress Notes (Signed)
He presents today for follow-up of his plantar fasciitis bilateral. He states that he is doing much better and states that he thinks he may be 60-70% improved. He continues his anti-inflammatory daily.  Objective: Vital signs are stable alert and oriented 3. Pulses are palpable. Neurologic sensorium is intact. Deep tendon reflexes are intact. He has marginal pain on palpation medially continue tubercle bilateral  Assessment: Resolving plantar fasciitis percent bilateral.  Plan: I injected the area today with Kenalog and local anesthetic bilaterally to the point of maximal tenderness at the site of the plantar fascia and calcaneal insertion site. I will put him down a follow-up with him in one more month.

## 2016-05-29 ENCOUNTER — Ambulatory Visit: Payer: BLUE CROSS/BLUE SHIELD | Admitting: Podiatry

## 2016-08-04 ENCOUNTER — Other Ambulatory Visit: Payer: Self-pay | Admitting: Podiatry

## 2016-08-04 DIAGNOSIS — M5441 Lumbago with sciatica, right side: Secondary | ICD-10-CM | POA: Insufficient documentation

## 2016-08-04 NOTE — Telephone Encounter (Signed)
Pt needs an appt prior to future refills. 

## 2016-08-07 ENCOUNTER — Other Ambulatory Visit: Payer: Self-pay | Admitting: Podiatry

## 2016-09-02 ENCOUNTER — Other Ambulatory Visit: Payer: Self-pay | Admitting: Emergency Medicine

## 2016-09-02 MED ORDER — LISINOPRIL-HYDROCHLOROTHIAZIDE 20-12.5 MG PO TABS: 1.0000 | ORAL_TABLET | Freq: Every day | ORAL | 5 refills | Status: DC

## 2016-09-10 ENCOUNTER — Telehealth: Payer: Self-pay | Admitting: Physician Assistant

## 2016-09-10 NOTE — Telephone Encounter (Signed)
error:[id number] ° °

## 2016-09-15 ENCOUNTER — Ambulatory Visit: Payer: BLUE CROSS/BLUE SHIELD | Admitting: Physician Assistant

## 2016-10-10 ENCOUNTER — Other Ambulatory Visit: Payer: Self-pay | Admitting: Podiatry

## 2016-10-13 NOTE — Telephone Encounter (Signed)
Pt needs an appt prior to future refills. 

## 2016-11-10 ENCOUNTER — Other Ambulatory Visit: Payer: Self-pay | Admitting: Physician Assistant

## 2018-02-24 ENCOUNTER — Encounter: Payer: Self-pay | Admitting: General Practice

## 2018-10-18 DIAGNOSIS — E119 Type 2 diabetes mellitus without complications: Secondary | ICD-10-CM | POA: Insufficient documentation

## 2018-11-08 ENCOUNTER — Other Ambulatory Visit: Payer: Self-pay | Admitting: Cardiology

## 2018-11-08 DIAGNOSIS — R0602 Shortness of breath: Secondary | ICD-10-CM

## 2018-11-08 DIAGNOSIS — I493 Ventricular premature depolarization: Secondary | ICD-10-CM

## 2018-11-08 DIAGNOSIS — R079 Chest pain, unspecified: Secondary | ICD-10-CM

## 2018-11-08 DIAGNOSIS — I10 Essential (primary) hypertension: Secondary | ICD-10-CM

## 2018-11-16 DIAGNOSIS — Z86010 Personal history of colonic polyps: Secondary | ICD-10-CM | POA: Insufficient documentation

## 2018-11-22 ENCOUNTER — Ambulatory Visit: Payer: Managed Care, Other (non HMO)

## 2018-11-22 DIAGNOSIS — I493 Ventricular premature depolarization: Secondary | ICD-10-CM | POA: Diagnosis not present

## 2018-11-22 DIAGNOSIS — I10 Essential (primary) hypertension: Secondary | ICD-10-CM | POA: Diagnosis not present

## 2018-11-28 ENCOUNTER — Telehealth: Payer: Self-pay | Admitting: Cardiology

## 2018-12-06 ENCOUNTER — Other Ambulatory Visit: Payer: Self-pay | Admitting: Cardiology

## 2018-12-06 ENCOUNTER — Ambulatory Visit: Payer: Managed Care, Other (non HMO)

## 2018-12-06 DIAGNOSIS — I493 Ventricular premature depolarization: Secondary | ICD-10-CM

## 2018-12-06 DIAGNOSIS — I10 Essential (primary) hypertension: Secondary | ICD-10-CM

## 2018-12-13 ENCOUNTER — Ambulatory Visit (INDEPENDENT_AMBULATORY_CARE_PROVIDER_SITE_OTHER): Payer: Managed Care, Other (non HMO) | Admitting: Cardiology

## 2018-12-13 ENCOUNTER — Encounter: Payer: Self-pay | Admitting: Cardiology

## 2018-12-13 ENCOUNTER — Other Ambulatory Visit: Payer: Self-pay

## 2018-12-13 VITALS — BP 170/88 | HR 64 | Ht 70.0 in | Wt 202.0 lb

## 2018-12-13 DIAGNOSIS — I358 Other nonrheumatic aortic valve disorders: Secondary | ICD-10-CM | POA: Diagnosis not present

## 2018-12-13 DIAGNOSIS — R0609 Other forms of dyspnea: Secondary | ICD-10-CM | POA: Diagnosis not present

## 2018-12-13 DIAGNOSIS — I10 Essential (primary) hypertension: Secondary | ICD-10-CM

## 2018-12-13 DIAGNOSIS — R9439 Abnormal result of other cardiovascular function study: Secondary | ICD-10-CM | POA: Insufficient documentation

## 2018-12-13 DIAGNOSIS — I493 Ventricular premature depolarization: Secondary | ICD-10-CM

## 2018-12-13 DIAGNOSIS — Z87891 Personal history of nicotine dependence: Secondary | ICD-10-CM

## 2018-12-13 MED ORDER — METOPROLOL SUCCINATE ER 25 MG PO TB24: 25.0000 mg | ORAL_TABLET | Freq: Every day | ORAL | 2 refills | Status: DC

## 2018-12-13 NOTE — Progress Notes (Signed)
Subjective:   Raymond Sandoval, male    DOB: 01-18-1966, 53 y.o.   MRN: [id number]  Ronney Lion, NP:  Chief Complaint  Patient presents with  . Hypertension    6 MONTH F/U     HPI: Raymond Sandoval  is a 53 y.o. male  with premature ventricular complexes. Patient with uncontrolled type 2 diabetes, hypertension, hyperlipidemia, former tobacco use, recently referred by Endocrinology, Dr. Talmage Nap, for evaluation of PVC's.  Patient was noted to have frequent PVC's on EKG. He reports having symptoms of palpitations for many years. No worsening in frequency or duration. Describes as occasional "flip flopping" or "flutter in his chest". He denies any heart racing, chest pain, or dizziness. No syncope.  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 He states that he did notice while swimming that he had shortness of breath while doing this, but attributed this to his technique. States that he was not able to improve his exercise tolerance.   He is a former smoker that quit for 20 years   Past Medical History:  Diagnosis Date  . Appendicitis   . Diabetes (HCC) 2006  . Hypertension     Past Surgical History:  Procedure Laterality Date  . APPENDECTOMY  1975  . VASECTOMY    . WISDOM TOOTH EXTRACTION      Family History  Problem Relation Age of Onset  . Hypertension Mother        Hulda Marin  . Diabetes Mother   . Heart disease Mother   . Arthritis Mother   . Hypertension Father 68       Deceased  . Kidney failure Father   . Diabetes Father   . Heart disease Father   . Diabetes Paternal Grandfather   . Cancer Paternal Grandfather   . Heart disease Other        Grandparents  . Diabetes Paternal Aunt        x2  . Heart disease Other        Paterna.l  Aunts & Uncles  . Healthy Brother        x1  . Healthy Son        x1    Social History   Socioeconomic History  . Marital status: Married    Spouse name: Not on file  . Number of children: Not on file  . Years of education: Not on file  . Highest education level: Not on file  Occupational History  . Occupation: Air traffic controller: [employer]  Social Needs  . Financial resource strain: Not on file  . Food insecurity:    Worry: Not on file    Inability: Not on file  . Transportation needs:    Medical: Not on file    Non-medical: Not on file  Tobacco Use  . Smoking status: Former Smoker    Packs/day: 0.10    Years: 12.00    Pack years: 1.20    Types: Cigarettes  . Smokeless tobacco: Former Neurosurgeon    Types: Chew  . Tobacco comment: Social Only  Substance and Sexual Activity  . Alcohol use: Yes    Alcohol/week: 0.0 standard drinks    Comment: rare  . Drug use: No  . Sexual  activity: Yes    Partners: Female  Lifestyle  . Physical activity:    Days per week: Not on file    Minutes per session: Not on file  . Stress: Not on file  Relationships  . Social connections:    Talks on phone: Not on file    Gets together: Not on file    Attends religious service: Not on file    Active member of club or organization: Not on file    Attends meetings of clubs or organizations: Not on file    Relationship status: Not on file  . Intimate partner violence:    Fear of current or ex partner: Not on file    Emotionally abused: Not on file    Physically abused: Not on file    Forced sexual activity: Not on file  Other Topics Concern  . Not on file  Social History Narrative   1 child -- boy.    Current Meds  Medication Sig  . amLODipine (NORVASC) 5 MG tablet Take 5 mg by mouth daily.  Marland Kitchen atorvastatin (LIPITOR) 10 MG tablet Take 1 tablet by mouth daily.  Marland Kitchen glimepiride (AMARYL) 2 MG tablet Take 2 mg by mouth 2 (two) times a week.  Marland Kitchen JANUMET XR 50-1000 MG TB24 Take 1 tablet by mouth  daily.  Marland Kitchen losartan (COZAAR) 100 MG tablet Take 100 mg by mouth daily.  . tadalafil (CIALIS) 5 MG tablet Take 1 tablet (5 mg total) by mouth daily as needed for erectile dysfunction.  . [DISCONTINUED] Acetaminophen 500 MG coapsule Take 1,000 mg by mouth every 4 (four) hours as needed for fever.  . [DISCONTINUED] albuterol (PROVENTIL HFA;VENTOLIN HFA) 108 (90 Base) MCG/ACT inhaler Inhale into the lungs every 6 (six) hours as needed for wheezing or shortness of breath.  . [DISCONTINUED] budesonide-formoterol (SYMBICORT) 160-4.5 MCG/ACT inhaler Inhale 2 puffs into the lungs daily.  . [DISCONTINUED] Cholecalciferol (VITAMIN D3) 125 MCG (5000 UT) CAPS Take 1 capsule by mouth daily.  . [DISCONTINUED] co-enzyme Q-10 30 MG capsule Take 30 mg by mouth 3 (three) times daily.  . [DISCONTINUED] esomeprazole (NEXIUM) 20 MG capsule Take 20 mg by mouth every other day.  . [DISCONTINUED] fexofenadine (ALLEGRA) 180 MG tablet Take 180 mg by mouth daily.  . [DISCONTINUED] flecainide (TAMBOCOR) 100 MG tablet Take 100 mg by mouth 2 (two) times daily.  . [DISCONTINUED] fluticasone (FLONASE) 50 MCG/ACT nasal spray Place into both nostrils daily.  . [DISCONTINUED] losartan-hydrochlorothiazide (HYZAAR) 100-12.5 MG tablet Take 1 tablet by mouth daily.  . [DISCONTINUED] meloxicam (MOBIC) 15 MG tablet TAKE ONE TABLET BY MOUTH ONCE DAILY (Patient taking differently: Take 15 mg by mouth as needed. )  . [DISCONTINUED] metoprolol tartrate (LOPRESSOR) 25 MG tablet Take 25 mg by mouth 2 (two) times daily.  . [DISCONTINUED] rivaroxaban (XARELTO) 20 MG TABS tablet Take 20 mg by mouth daily with supper.  . [DISCONTINUED] sildenafil (VIAGRA) 50 MG tablet Take 50 mg by mouth daily as needed for erectile dysfunction.  . [DISCONTINUED] traMADol (ULTRAM) 50 MG tablet Take 50 mg by mouth at bedtime.  . [DISCONTINUED] zolpidem (AMBIEN) 5 MG tablet Take 5 mg by mouth at bedtime as needed for sleep.     Review of Systems  Constitution:  Negative for decreased appetite, malaise/fatigue, weight gain and weight loss.  Eyes: Negative for visual disturbance.  Cardiovascular: Positive for dyspnea on exertion and palpitations. Negative for chest pain, claudication, leg swelling, orthopnea and syncope.  Respiratory: Negative for hemoptysis and wheezing.  Endocrine: Negative for cold intolerance and heat intolerance.  Hematologic/Lymphatic: Does not bruise/bleed easily.  Skin: Negative for nail changes.  Musculoskeletal: Negative for muscle weakness and myalgias.  Gastrointestinal: Negative for abdominal pain, change in bowel habit, nausea and vomiting.  Neurological: Negative for difficulty with concentration, dizziness, focal weakness and headaches.  Psychiatric/Behavioral: Negative for altered mental status and suicidal ideas.  All other systems reviewed and are negative.      Objective:     Blood pressure (!) 170/88, pulse 64, height  (1.778 m), weight 202 lb (91.6 kg), SpO2 100 %.  Cardiac studies:  3 day Holter monitor 11/01/2018: Sinus rhythm with frequent PVC's (21.6%). 2 runs of atrial tachycardia on day 3 and 4. Min HR 45 bpm and Max HR 128 bpm. No A fib or flutter noted.   Exercise sestamibi stress test 11/22/2018: 1. The patient performed treadmill exercise using Bruce protocol, completing 7:16 minutes. The patient completed an estimated workload of 8.9 METS, reaching 99% of the maximum predicted heart rate. Exercise capacity was low normal. Hemodynamic response was normal. Stress symptoms included fatigue. No ischemic changes seen on stress electrocardiogram. Frequent PVC's seen both at rest and during exercise. 2. The overall quality of the study is good. There is no evidence of abnormal lung activity. Stress and rest SPECT images demonstrate homogeneous tracer distribution throughout the myocardium. Gated SPECT imaging reveals normal myocardial thickening and wall motion. The left ventricular ejection  fraction was normal (50%).  3. Intermediate risk study due to frequent PVC's. No SPECT evidence of ischemia or infarction.  Echocardiogram 12/06/2018: Left ventricle cavity is normal in size. Mild concentric hypertrophy of the left ventricle. Normal global wall motion. Normal diastolic filling pattern. Calculated EF 55%. Left atrial cavity is mildly dilated. Mild (Grade I) mitral regurgitation. Inadequate TR jet to estimate pulmonary artery systolic pressure. IVC is dilated with respiratory variation. This may suggest elevated right heart pressure. Frequent PVC's seen throughout the study.   Physical Exam  Constitutional: He is oriented to person, place, and time. Vital signs are normal. He appears well-developed and well-nourished.  HENT:  Head: Normocephalic and atraumatic.  Neck: Normal range of motion.  Cardiovascular: Normal rate, regular rhythm and intact distal pulses. Frequent extrasystoles are present.  Murmur heard.  Early systolic murmur is present with a grade of 2/6 at the upper right sternal border. Pulmonary/Chest: Effort normal and breath sounds normal. No accessory muscle usage. No respiratory distress.  Abdominal: Soft. Bowel sounds are normal.  Musculoskeletal: Normal range of motion.  Neurological: He is alert and oriented to person, place, and time.  Skin: Skin is warm and dry.  Vitals reviewed.       Assessment & Recommendations:  1. Frequent PVCs Noted to have 14% PVC burden on event monitor. Fortunately, no evidence of PVC induced cardiomyopathy. Unsure of etiology of his frequent PVC's as he had no evidence of ischemia on stress testing. PVC's did not worsen during exercise. I have recommended starting Metoprolol succinate  daily for PVC's and also hypertension. He is minimally symptomatic in regard to his palpitations.  2. Essential hypertension Elevated today and at his last office visit, will start Metoprolol as stated above.   3. Aortic systolic  murmur on examination No valvular abnormality noted on echo. Suspect flow murmur.   4. Dyspnea on exertion Patient notes that he had decreased exercise tolerance with swimming; however, he was able to run a marathon a few years ago. Echo also suggested elevated right heart pressures.  Has history of former tobacco use. He would possibly benefit from PFT's and will consider pulmonary evaluation.   5. Former tobacco use Has 12 pack year history. Congratulated on remaining abstinent from tobacco use.   Plan: I will see him back in 4 weeks for follow up on PVC's and HTN.   [place], MSN, APRN, FNP-C [facility] Office: [phone] Fax: [phone]

## 2018-12-29 NOTE — Progress Notes (Signed)
Echo. Shows normal pumping function of heart.

## 2019-01-17 ENCOUNTER — Ambulatory Visit: Payer: Managed Care, Other (non HMO) | Admitting: Cardiology

## 2019-02-21 ENCOUNTER — Ambulatory Visit: Payer: Managed Care, Other (non HMO) | Admitting: Cardiology

## 2019-04-11 ENCOUNTER — Ambulatory Visit: Payer: Managed Care, Other (non HMO) | Admitting: Cardiology

## 2019-04-17 NOTE — Telephone Encounter (Signed)
Patient aware of results.

## 2019-05-16 ENCOUNTER — Ambulatory Visit: Payer: Managed Care, Other (non HMO) | Admitting: Cardiology

## 2019-05-24 ENCOUNTER — Ambulatory Visit: Payer: Managed Care, Other (non HMO) | Admitting: Cardiology

## 2019-06-20 ENCOUNTER — Ambulatory Visit: Payer: Managed Care, Other (non HMO) | Admitting: Cardiology

## 2019-07-25 ENCOUNTER — Other Ambulatory Visit: Payer: Self-pay | Admitting: Cardiology

## 2019-07-25 DIAGNOSIS — I10 Essential (primary) hypertension: Secondary | ICD-10-CM

## 2019-08-15 ENCOUNTER — Ambulatory Visit: Payer: Managed Care, Other (non HMO)

## 2019-08-15 ENCOUNTER — Other Ambulatory Visit: Payer: Self-pay

## 2019-08-15 ENCOUNTER — Ambulatory Visit (INDEPENDENT_AMBULATORY_CARE_PROVIDER_SITE_OTHER): Payer: Managed Care, Other (non HMO) | Admitting: Cardiology

## 2019-08-15 ENCOUNTER — Encounter: Payer: Self-pay | Admitting: Cardiology

## 2019-08-15 VITALS — BP 178/94 | HR 43 | Temp 98.2°F | Ht 71.0 in | Wt 202.0 lb

## 2019-08-15 DIAGNOSIS — E1165 Type 2 diabetes mellitus with hyperglycemia: Secondary | ICD-10-CM | POA: Diagnosis not present

## 2019-08-15 DIAGNOSIS — I493 Ventricular premature depolarization: Secondary | ICD-10-CM

## 2019-08-15 DIAGNOSIS — I10 Essential (primary) hypertension: Secondary | ICD-10-CM | POA: Diagnosis not present

## 2019-08-15 NOTE — Progress Notes (Signed)
Primary Physician:  Wallis Bamberg, PA-C   Patient ID: Raymond Sandoval, male    DOB: 1966/03/09, 53 y.o.   MRN: [id number]  Subjective:    Chief Complaint  Patient presents with  . Hypertension  . PVC  . Follow-up    4 weeks    HPI: Raymond Sandoval  is a 53 y.o. male  with  uncontrolled type 2 diabetes, hypertension, hyperlipidemia, former tobacco use, with frequent PVC's. He was started on Metoprolol succinate at his last visit in March and now presents for follow up.   Patient has had symptoms of palpitations that he describes as flip-flopping sensation for several years, but states that since being on metoprolol, palpitations have resolved.  He is tolerating medications well and feels that he is doing well.  Does state that he is due to have his diabetes checked.  Patient does report being diagnosed with murmur at age 23 that he states was evaluated by the [employer] and he reports that went away with exercise. Echo in march 2020 revealed mild MR, but otherwise no significant valvular abnormality.  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 He states that he did notice while swimming that he had shortness of breath while doing this, but attributed this to his technique. States that he was not able to improve his exercise tolerance.   He is a former smoker that quit for 20 years  Past Medical History:  Diagnosis Date  . Appendicitis   . Diabetes (HCC) 2006  . Hyperlipidemia   . Hypertension     Past Surgical History:  Procedure Laterality Date  . APPENDECTOMY  1975  . VASECTOMY    . WISDOM TOOTH EXTRACTION      Social History   Socioeconomic History  . Marital status: Married    Spouse name: Not on file  . Number of children: 1  . Years of education: Not on file  . Highest education level: Not on file  Occupational History  . Occupation: Air traffic controller: [employer]  Social Needs  .  Financial resource strain: Not on file  . Food insecurity    Worry: Not on file    Inability: Not on file  . Transportation needs    Medical: Not on file    Non-medical: Not on file  Tobacco Use  . Smoking status: Former Smoker    Packs/day: 0.10    Years: 12.00    Pack years: 1.20    Types: Cigarettes    Quit date: 1990    Years since quitting: 30.8  . Smokeless tobacco: Former Neurosurgeon    Types: Chew  . Tobacco comment: Social Only  Substance and Sexual Activity  . Alcohol use: Yes    Alcohol/week: 0.0 standard drinks    Comment: rare  . Drug use: No  . Sexual activity: Yes    Partners: Female  Lifestyle  . Physical activity    Days per week: Not on file    Minutes per session: Not on file  . Stress: Not on file  Relationships  . Social Musician on phone: Not on file    Gets together: Not on file    Attends religious service: Not on file    Active member of club or organization: Not on file    Attends meetings of clubs or organizations: Not on file    Relationship status:  Not on file  . Intimate partner violence    Fear of current or ex partner: Not on file    Emotionally abused: Not on file    Physically abused: Not on file    Forced sexual activity: Not on file  Other Topics Concern  . Not on file  Social History Narrative   1 child -- boy.    Review of Systems  Constitution: Negative for decreased appetite, malaise/fatigue, weight gain and weight loss.  Eyes: Negative for visual disturbance.  Cardiovascular: Positive for dyspnea on exertion and palpitations. Negative for chest pain, claudication, leg swelling, orthopnea and syncope.  Respiratory: Negative for hemoptysis and wheezing.   Endocrine: Negative for cold intolerance and heat intolerance.  Hematologic/Lymphatic: Does not bruise/bleed easily.  Skin: Negative for nail changes.  Musculoskeletal: Negative for muscle weakness and myalgias.  Gastrointestinal: Negative for abdominal pain,  change in bowel habit, nausea and vomiting.  Neurological: Negative for difficulty with concentration, dizziness, focal weakness and headaches.  Psychiatric/Behavioral: Negative for altered mental status and suicidal ideas.  All other systems reviewed and are negative.     Objective:  Blood pressure (!) 178/94, pulse (!) 43, temperature 98.2 F (36.8 C), height 5\' 11"  (1.803 m), weight 202 lb (91.6 kg), SpO2 100 %. Body mass index is 28.17 kg/m.    Physical Exam  Constitutional: He is oriented to person, place, and time. Vital signs are normal. He appears well-developed and well-nourished.  HENT:  Head: Normocephalic and atraumatic.  Neck: Normal range of motion.  Cardiovascular: Normal rate, regular rhythm and intact distal pulses. Frequent extrasystoles are present.  Murmur heard.  Early systolic murmur is present with a grade of 2/6 at the upper right sternal border. Pulmonary/Chest: Effort normal and breath sounds normal. No accessory muscle usage. No respiratory distress.  Abdominal: Soft. Bowel sounds are normal.  Musculoskeletal: Normal range of motion.  Neurological: He is alert and oriented to person, place, and time.  Skin: Skin is warm and dry.  Vitals reviewed.  Radiology: No results found.  Laboratory examination:    CMP Latest Ref Rng & Units 03/07/2016 12/04/2015 03/20/2015  Glucose 70 - 99 mg/dL 161(W181(H) 960(A137(H) 540(J222(H)  BUN 6 - 23 mg/dL 16 9 14   Creatinine 0.40 - 1.50 mg/dL 8.110.89 9.140.86 7.821.13  Sodium 135 - 145 mEq/L 138 141 138  Potassium 3.5 - 5.1 mEq/L 3.8 4.2 4.1  Chloride 96 - 112 mEq/L 101 104 105  CO2 19 - 32 mEq/L 26 30 26   Calcium 8.4 - 10.5 mg/dL 9.4 9.6 9.2   CBC Latest Ref Rng & Units 03/20/2015  WBC 4.0 - 10.5 K/uL 7.4  Hemoglobin 13.0 - 17.0 g/dL 12.9(L)  Hematocrit 39.0 - 52.0 % 37.3(L)  Platelets 150.0 - 400.0 K/uL 201.0   Lipid Panel     Component Value Date/Time   CHOL 177 12/04/2015 0922   TRIG 137.0 12/04/2015 0922   HDL 41.30 12/04/2015  0922   CHOLHDL 4 12/04/2015 0922   VLDL 27.4 12/04/2015 0922   LDLCALC 108 (H) 12/04/2015 0922   LDLDIRECT 117.0 11/07/2014 0819   HEMOGLOBIN A1C Lab Results  Component Value Date   HGBA1C 7.8 (H) 03/07/2016   TSH No results for input(s): TSH in the last 8760 hours.  PRN Meds:. Medications Discontinued During This Encounter  Medication Reason  . hydrochlorothiazide (HYDRODIURIL) 12.5 MG tablet Error   Current Meds  Medication Sig  . atorvastatin (LIPITOR) 10 MG tablet Take 1 tablet by mouth daily.  Marland Kitchen. glimepiride (  AMARYL) 2 MG tablet Take 2 mg by mouth 2 (two) times a week.  Marland Kitchen JANUMET XR 50-1000 MG TB24 Take 1 tablet by mouth daily.  Marland Kitchen losartan (COZAAR) 100 MG tablet Take 100 mg by mouth daily.  . metFORMIN (GLUCOPHAGE) 500 MG tablet Take 500 mg by mouth 2 (two) times daily with a meal.  . metoprolol succinate (TOPROL-XL) 25 MG 24 hr tablet Take 1 tablet by mouth once daily with food  . tadalafil (CIALIS) 5 MG tablet Take 1 tablet (5 mg total) by mouth daily as needed for erectile dysfunction.    Cardiac Studies:   3 day Holter monitor 11/01/2018: Sinus rhythm with frequent PVC's (21.6%). 2 runs of atrial tachycardia on day 3 and 4. Min HR 45 bpm and Max HR 128 bpm. No A fib or flutter noted.   Exercise sestamibi stress test 11/22/2018: 1. The patient performed treadmill exercise using Bruce protocol, completing 7:16 minutes. The patient completed an estimated workload of 8.9 METS, reaching 99% of the maximum predicted heart rate. Exercise capacity was low normal. Hemodynamic response was normal. Stress symptoms included fatigue. No ischemic changes seen on stress electrocardiogram. Frequent PVC's seen both at rest and during exercise. 2. The overall quality of the study is good. There is no evidence of abnormal lung activity. Stress and rest SPECT images demonstrate homogeneous tracer distribution throughout the myocardium. Gated SPECT imaging reveals normal myocardial  thickening and wall motion. The left ventricular ejection fraction was normal (50%).  3. Intermediate risk study due to frequent PVC's. No SPECT evidence of ischemia or infarction.  Echocardiogram 12/06/2018: Left ventricle cavity is normal in size. Mild concentric hypertrophy of the left ventricle. Normal global wall motion. Normal diastolic filling pattern. Calculated EF 55%. Left atrial cavity is mildly dilated. Mild (Grade I) mitral regurgitation. Inadequate TR jet to estimate pulmonary artery systolic pressure. IVC is dilated with respiratory variation. This may suggest elevated right heart pressure. Frequent PVC's seen throughout the study.  Assessment:   Frequent PVCs - Plan: HOLTER MONITOR - 24 HOUR  Essential hypertension - Plan: EKG 12-Lead  EKG 08/15/2019: Normal sinus rhythm at 60 bpm with frequent PVC's, normal axis, no evidence of ischemia.   Recommendations:   Patient is here for follow-up for frequent PVCs.  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  He does report though that he is feeling much better since being on metoprolol and his palpitations have since resolved.  His resting heart rate is borderline low; therefore, I am hesitant to further increase his metoprolol at this point.  I recommended repeating his Holter monitor for 24 hours to reevaluate his PVC burden with being on metoprolol.  He has previously had echocardiogram that showed normal LVEF.  Further options would include PVC ablation versus watchful waiting and continued medical therapy.  Depending upon his PVC burden, I will notify him if we will plan to further increase his metoprolol.  Blood pressure is elevated in our office, but generally is well controlled. He will monitor at home for the next few days and we will discuss over the phone when I call him to notify of holter results.  He is also due for follow-up with his endocrinologist for diabetes.  He will schedule an appointment for this.  I  will plan to see him back in 8 weeks for follow-up.  Miquel Dunn, MSN, APRN, FNP-C [facility]. [place] Office: [phone] Fax: [phone]

## 2019-08-17 DIAGNOSIS — I493 Ventricular premature depolarization: Secondary | ICD-10-CM | POA: Diagnosis not present

## 2019-08-25 ENCOUNTER — Other Ambulatory Visit: Payer: Self-pay | Admitting: Cardiology

## 2019-08-25 DIAGNOSIS — I10 Essential (primary) hypertension: Secondary | ICD-10-CM

## 2019-08-29 ENCOUNTER — Telehealth: Payer: Self-pay

## 2019-08-29 NOTE — Telephone Encounter (Signed)
Lvm for pt

## 2019-08-29 NOTE — Telephone Encounter (Signed)
Patient is calling for Holter results

## 2019-08-29 NOTE — Telephone Encounter (Signed)
I haven't received them yet.

## 2019-09-15 ENCOUNTER — Other Ambulatory Visit: Payer: Self-pay

## 2019-09-15 ENCOUNTER — Telehealth: Payer: Self-pay

## 2019-09-15 DIAGNOSIS — I10 Essential (primary) hypertension: Secondary | ICD-10-CM

## 2019-09-15 MED ORDER — METOPROLOL SUCCINATE ER 50 MG PO TB24: 50.0000 mg | ORAL_TABLET | Freq: Every day | ORAL | 3 refills | Status: DC

## 2019-09-15 NOTE — Telephone Encounter (Signed)
Pt is aware of holter results; also his medication was called in

## 2019-09-15 NOTE — Telephone Encounter (Signed)
Pt says that he wants a refill on metoprolol 25 mg he says he wants 50mg  since he takes it twice a day; I dont see where he takes it twice a day?

## 2019-09-19 ENCOUNTER — Other Ambulatory Visit: Payer: Self-pay

## 2019-09-19 ENCOUNTER — Encounter: Payer: Self-pay | Admitting: Family Medicine

## 2019-09-19 ENCOUNTER — Ambulatory Visit (INDEPENDENT_AMBULATORY_CARE_PROVIDER_SITE_OTHER): Payer: Managed Care, Other (non HMO) | Admitting: Family Medicine

## 2019-09-19 VITALS — BP 158/88 | HR 45 | Temp 97.6°F | Ht 71.0 in | Wt 199.6 lb

## 2019-09-19 DIAGNOSIS — N5201 Erectile dysfunction due to arterial insufficiency: Secondary | ICD-10-CM | POA: Diagnosis not present

## 2019-09-19 DIAGNOSIS — Z125 Encounter for screening for malignant neoplasm of prostate: Secondary | ICD-10-CM

## 2019-09-19 DIAGNOSIS — E7800 Pure hypercholesterolemia, unspecified: Secondary | ICD-10-CM

## 2019-09-19 DIAGNOSIS — E1142 Type 2 diabetes mellitus with diabetic polyneuropathy: Secondary | ICD-10-CM

## 2019-09-19 DIAGNOSIS — I10 Essential (primary) hypertension: Secondary | ICD-10-CM

## 2019-09-19 DIAGNOSIS — Z0000 Encounter for general adult medical examination without abnormal findings: Secondary | ICD-10-CM | POA: Insufficient documentation

## 2019-09-19 LAB — URINALYSIS, ROUTINE W REFLEX MICROSCOPIC
Bilirubin Urine: NEGATIVE
Hgb urine dipstick: NEGATIVE
Ketones, ur: NEGATIVE
Leukocytes,Ua: NEGATIVE
Nitrite: NEGATIVE
RBC / HPF: NONE SEEN (ref 0–?)
Specific Gravity, Urine: 1.015 (ref 1.000–1.030)
Total Protein, Urine: NEGATIVE
Urine Glucose: NEGATIVE
Urobilinogen, UA: 0.2 (ref 0.0–1.0)
pH: 6 (ref 5.0–8.0)

## 2019-09-19 LAB — COMPREHENSIVE METABOLIC PANEL
ALT: 39 U/L (ref 0–53)
AST: 25 U/L (ref 0–37)
Albumin: 4.9 g/dL (ref 3.5–5.2)
Alkaline Phosphatase: 79 U/L (ref 39–117)
BUN: 11 mg/dL (ref 6–23)
CO2: 24 mEq/L (ref 19–32)
Calcium: 9.8 mg/dL (ref 8.4–10.5)
Chloride: 103 mEq/L (ref 96–112)
Creatinine, Ser: 0.92 mg/dL (ref 0.40–1.50)
GFR: 86.16 mL/min (ref >60.00)
Glucose, Bld: 175 mg/dL — ABNORMAL HIGH (ref 70–99)
Potassium: 4 mEq/L (ref 3.5–5.1)
Sodium: 138 mEq/L (ref 135–145)
Total Bilirubin: 0.6 mg/dL (ref 0.2–1.2)
Total Protein: 7.4 g/dL (ref 6.0–8.3)

## 2019-09-19 LAB — CBC
HCT: 43.9 % (ref 39.0–52.0)
Hemoglobin: 15 g/dL (ref 13.0–17.0)
MCHC: 34 g/dL (ref 30.0–36.0)
MCV: 83.3 fl (ref 78.0–100.0)
Platelets: 229 10*3/uL (ref 150.0–400.0)
RBC: 5.28 Mil/uL (ref 4.22–5.81)
RDW: 13.4 % (ref 11.5–15.5)
WBC: 7.8 10*3/uL (ref 4.0–10.5)

## 2019-09-19 LAB — LIPID PANEL
Cholesterol: 172 mg/dL (ref 0–200)
HDL: 41.1 mg/dL (ref >39.00)
LDL Cholesterol: 105 mg/dL — ABNORMAL HIGH (ref 0–99)
NonHDL: 130.89
Total CHOL/HDL Ratio: 4
Triglycerides: 131 mg/dL (ref 0.0–149.0)
VLDL: 26.2 mg/dL (ref 0.0–40.0)

## 2019-09-19 LAB — MICROALBUMIN / CREATININE URINE RATIO
Creatinine,U: 74.2 mg/dL
Microalb Creat Ratio: 1.5 mg/g (ref 0.0–30.0)
Microalb, Ur: 1.1 mg/dL (ref 0.0–1.9)

## 2019-09-19 LAB — PSA: PSA: 0.73 ng/mL (ref 0.10–4.00)

## 2019-09-19 LAB — TSH: TSH: 1.9 u[IU]/mL (ref 0.35–4.50)

## 2019-09-19 LAB — LDL CHOLESTEROL, DIRECT: Direct LDL: 109 mg/dL

## 2019-09-19 MED ORDER — TADALAFIL 5 MG PO TABS: 5.0000 mg | ORAL_TABLET | Freq: Every day | ORAL | 3 refills | Status: DC | PRN

## 2019-09-19 MED ORDER — AMLODIPINE BESYLATE 10 MG PO TABS: 10.0000 mg | ORAL_TABLET | Freq: Every day | ORAL | 3 refills | Status: AC

## 2019-09-19 MED ORDER — ATORVASTATIN CALCIUM 20 MG PO TABS: 20.0000 mg | ORAL_TABLET | Freq: Every day | ORAL | 3 refills | Status: DC

## 2019-09-19 NOTE — Progress Notes (Signed)
New Patient Office Visit  Subjective:  Patient ID: Raymond Sandoval, male    DOB: May 29, 1966  Age: 53 y.o. MRN: [id number]  CC:  Chief Complaint  Patient presents with  . New Patient (Initial Visit)    HPI Raymond Sandoval presents for establishment of care and management of his hypertension and elevated cholesterol.  He is seeing cardiology for control of his PVCs.  He is taking metoprolol with this issue with good effect.  He does have a history of ongoing bradycardia.  He says that he is not particularly symptomatic from it.  Endocrinology is managing his diabetes at this time.  Last hemoglobin A1c is 7.8.  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  His blood pressure has been elevated on his current regimen Norvasc 5 mg, losartan 100 mg and then the metoprolol for his PVCs.  Last LDL cholesterol was 137.  He has no documented vascular disease at this point.  Uses low-dose Cialis as needed for ED.  He is an active person.  Not currently working out.  Up-to-date with his dental care and eye checks.  Past Medical History:  Diagnosis Date  . Appendicitis   . Diabetes ([place]) 2006  . Hyperlipidemia   . Hypertension     Past Surgical History:  Procedure Laterality Date  . APPENDECTOMY  1975  . VASECTOMY    . WISDOM TOOTH EXTRACTION      Family History  Problem Relation Age of Onset  . Hypertension Mother        Shearon Stalls  . Diabetes Mother   . Heart disease Mother   . Arthritis Mother   . Hypertension Father 75       Deceased  . Kidney failure Father   . Diabetes Father   . Heart disease Father   . Diabetes Paternal Grandfather   . Cancer Paternal Grandfather   . Heart disease Other        Grandparents  . Diabetes Paternal Aunt        x2  . Heart disease Other        Paterna.l Aunts & Uncles  . Healthy Brother        x1  . Healthy Son        x1    Social History   Socioeconomic History  . Marital status: Married    Spouse name: Not on file  .  Number of children: 1  . Years of education: Not on file  . Highest education level: Not on file  Occupational History  . Occupation: Education administrator: [employer]  Tobacco Use  . Smoking status: Former Smoker    Packs/day: 0.10    Years: 12.00    Pack years: 1.20    Types: Cigarettes    Quit date: 1990    Years since quitting: 30.9  . Smokeless tobacco: Former Systems developer    Types: Chew  . Tobacco comment: Social Only  Substance and Sexual Activity  . Alcohol use: Yes    Alcohol/week: 0.0 standard drinks    Comment: rare  . Drug use: No  . Sexual activity: Yes    Partners: Female  Other Topics Concern  . Not on file  Social History Narrative   1 child -- boy.   Social Determinants of Health   Financial Resource Strain:   . Difficulty of Paying Living Expenses: Not on file  Food Insecurity:   . Worried About Running  Out of Food in the Last Year: Not on file  . Ran Out of Food in the Last Year: Not on file  Transportation Needs:   . Lack of Transportation (Medical): Not on file  . Lack of Transportation (Non-Medical): Not on file  Physical Activity:   . Days of Exercise per Week: Not on file  . Minutes of Exercise per Session: Not on file  Stress:   . Feeling of Stress : Not on file  Social Connections:   . Frequency of Communication with Friends and Family: Not on file  . Frequency of Social Gatherings with Friends and Family: Not on file  . Attends Religious Services: Not on file  . Active Member of Clubs or Organizations: Not on file  . Attends Archivist Meetings: Not on file  . Marital Status: Not on file  Intimate Partner Violence:   . Fear of Current or Ex-Partner: Not on file  . Emotionally Abused: Not on file  . Physically Abused: Not on file  . Sexually Abused: Not on file    ROS Review of Systems  Constitutional: Negative.   HENT: Negative.   Eyes: Negative for photophobia and visual disturbance.  Respiratory: Negative.   Cardiovascular:  Negative.   Gastrointestinal: Negative.   Endocrine: Negative for polyphagia and polyuria.  Genitourinary: Negative.   Musculoskeletal: Negative for gait problem and joint swelling.  Skin: Negative for pallor and rash.  Neurological: Negative for light-headedness and headaches.  Hematological: Does not bruise/bleed easily.  Psychiatric/Behavioral: Negative.     Objective:   Today's Vitals: BP (!) 158/88   Pulse (!) 45   Temp 97.6 F (36.4 C)   Ht 5' 11"  (1.803 m)   Wt 199 lb 9.6 oz (90.5 kg)   SpO2 99%   BMI 27.84 kg/m   Physical Exam Vitals and nursing note reviewed.  Constitutional:      General: He is not in acute distress.    Appearance: Normal appearance. He is normal weight. He is not ill-appearing, toxic-appearing or diaphoretic.  HENT:     Head: Normocephalic and atraumatic.     Right Ear: External ear normal.     Left Ear: External ear normal.     Nose: No congestion or rhinorrhea.     Mouth/Throat:     Mouth: Mucous membranes are moist.     Pharynx: Oropharynx is clear. No oropharyngeal exudate or posterior oropharyngeal erythema.  Eyes:     General: No scleral icterus.       Right eye: No discharge.        Left eye: No discharge.     Extraocular Movements: Extraocular movements intact.     Conjunctiva/sclera: Conjunctivae normal.     Pupils: Pupils are equal, round, and reactive to light.  Neck:     Vascular: No carotid bruit.  Cardiovascular:     Rate and Rhythm: Regular rhythm. Bradycardia present.  Pulmonary:     Effort: Pulmonary effort is normal.     Breath sounds: Normal breath sounds. No stridor. No rhonchi.  Musculoskeletal:     Cervical back: No tenderness.     Right lower leg: No edema.     Left lower leg: No edema.  Lymphadenopathy:     Cervical: No cervical adenopathy.  Neurological:     Mental Status: He is alert and oriented to person, place, and time.  Psychiatric:        Mood and Affect: Mood normal.  Behavior: Behavior  normal.     Assessment & Plan:   Problem List Items Addressed This Visit      Cardiovascular and Mediastinum   Essential hypertension - Primary   Relevant Medications   amLODipine (NORVASC) 10 MG tablet   atorvastatin (LIPITOR) 20 MG tablet   tadalafil (CIALIS) 5 MG tablet   Other Relevant Orders   CBC   Comp Met (CMET)   Urinalysis, Routine w reflex microscopic   Urine Microalbumin w/creat. ratio   TSH   Erectile dysfunction due to arterial insufficiency   Relevant Medications   amLODipine (NORVASC) 10 MG tablet   atorvastatin (LIPITOR) 20 MG tablet   tadalafil (CIALIS) 5 MG tablet     Endocrine   Diabetic polyneuropathy associated with type 2 diabetes mellitus (HCC)   Relevant Medications   atorvastatin (LIPITOR) 20 MG tablet   Other Relevant Orders   Urinalysis, Routine w reflex microscopic   Urine Microalbumin w/creat. ratio     Other   Healthcare maintenance   Relevant Orders   PSA   Elevated cholesterol   Relevant Medications   amLODipine (NORVASC) 10 MG tablet   atorvastatin (LIPITOR) 20 MG tablet   tadalafil (CIALIS) 5 MG tablet   Other Relevant Orders   CBC   Comp Met (CMET)   Direct LDL   Lipid Profile      Outpatient Encounter Medications as of 09/19/2019  Medication Sig  . glimepiride (AMARYL) 2 MG tablet Take 2 mg by mouth daily with breakfast.   . JANUMET XR 50-1000 MG TB24 Take 1 tablet by mouth daily.  Marland Kitchen losartan (COZAAR) 100 MG tablet Take 100 mg by mouth daily.  . metFORMIN (GLUCOPHAGE) 500 MG tablet Take 500 mg by mouth 2 (two) times daily with a meal.  . metoprolol succinate (TOPROL-XL) 50 MG 24 hr tablet Take 1 tablet (50 mg total) by mouth daily.  . tadalafil (CIALIS) 5 MG tablet Take 1 tablet (5 mg total) by mouth daily as needed for erectile dysfunction.  . [DISCONTINUED] amLODipine (NORVASC) 5 MG tablet Take 5 mg by mouth daily.  . [DISCONTINUED] atorvastatin (LIPITOR) 10 MG tablet Take 1 tablet by mouth daily.  . [DISCONTINUED]  tadalafil (CIALIS) 5 MG tablet Take 1 tablet (5 mg total) by mouth daily as needed for erectile dysfunction.  Marland Kitchen amLODipine (NORVASC) 10 MG tablet Take 1 tablet (10 mg total) by mouth daily.  Marland Kitchen atorvastatin (LIPITOR) 20 MG tablet Take 1 tablet (20 mg total) by mouth daily.   No facility-administered encounter medications on file as of 09/19/2019.    Follow-up: Return in about 3 months (around 12/18/2019).   Have increased the amlodipine to 10 mg daily.  Increased his Lipitor to 20 mg daily.  Advised him to consume no more than 3 g of sodium a day.  He was given information on managing his hypertension.  Continue follow-up with endocrinology and cardiology.  Libby Maw, MD

## 2019-09-19 NOTE — Patient Instructions (Signed)
Managing Your Hypertension Hypertension is commonly called high blood pressure. This is when the force of your blood pressing against the walls of your arteries is too strong. Arteries are blood vessels that carry blood from your heart throughout your body. Hypertension forces the heart to work harder to pump blood, and may cause the arteries to become narrow or stiff. Having untreated or uncontrolled hypertension can cause heart attack, stroke, kidney disease, and other problems. What are blood pressure readings? A blood pressure reading consists of a higher number over a lower number. Ideally, your blood pressure should be below 120/80. The first ("top") number is called the systolic pressure. It is a measure of the pressure in your arteries as your heart beats. The second ("bottom") number is called the diastolic pressure. It is a measure of the pressure in your arteries as the heart relaxes. What does my blood pressure reading mean? Blood pressure is classified into four stages. Based on your blood pressure reading, your health care provider may use the following stages to determine what type of treatment you need, if any. Systolic pressure and diastolic pressure are measured in a unit called mm Hg. Normal  Systolic pressure: below 120.  Diastolic pressure: below 80. Elevated  Systolic pressure: 120-129.  Diastolic pressure: below 80. Hypertension stage 1  Systolic pressure: 130-139.  Diastolic pressure: 80-89. Hypertension stage 2  Systolic pressure: 140 or above.  Diastolic pressure: 90 or above. What health risks are associated with hypertension? Managing your hypertension is an important responsibility. Uncontrolled hypertension can lead to:  A heart attack.  A stroke.  A weakened blood vessel (aneurysm).  Heart failure.  Kidney damage.  Eye damage.  Metabolic syndrome.  Memory and concentration problems. What changes can I make to manage my  hypertension? Hypertension can be managed by making lifestyle changes and possibly by taking medicines. Your health care provider will help you make a plan to bring your blood pressure within a normal range. Eating and drinking   Eat a diet that is high in fiber and potassium, and low in salt (sodium), added sugar, and fat. An example eating plan is called the DASH (Dietary Approaches to Stop Hypertension) diet. To eat this way: ? Eat plenty of fresh fruits and vegetables. Try to fill half of your plate at each meal with fruits and vegetables. ? Eat whole grains, such as whole wheat pasta, brown rice, or whole grain bread. Fill about one quarter of your plate with whole grains. ? Eat low-fat diary products. ? Avoid fatty cuts of meat, processed or cured meats, and poultry with skin. Fill about one quarter of your plate with lean proteins such as fish, chicken without skin, beans, eggs, and tofu. ? Avoid premade and processed foods. These tend to be higher in sodium, added sugar, and fat.  Reduce your daily sodium intake. Most people with hypertension should eat less than 1,500 mg of sodium a day.  Limit alcohol intake to no more than 1 drink a day for nonpregnant women and 2 drinks a day for men. One drink equals 12 oz of beer, 5 oz of wine, or 1 oz of hard liquor. Lifestyle  Work with your health care provider to maintain a healthy body weight, or to lose weight. Ask what an ideal weight is for you.  Get at least 30 minutes of exercise that causes your heart to beat faster (aerobic exercise) most days of the week. Activities may include walking, swimming, or biking.  Include exercise   to strengthen your muscles (resistance exercise), such as weight lifting, as part of your weekly exercise routine. Try to do these types of exercises for 30 minutes at least 3 days a week.  Do not use any products that contain nicotine or tobacco, such as cigarettes and e-cigarettes. If you need help quitting,  ask your health care provider.  Control any long-term (chronic) conditions you have, such as high cholesterol or diabetes. Monitoring  Monitor your blood pressure at home as told by your health care provider. Your personal target blood pressure may vary depending on your medical conditions, your age, and other factors.  Have your blood pressure checked regularly, as often as told by your health care provider. Working with your health care provider  Review all the medicines you take with your health care provider because there may be side effects or interactions.  Talk with your health care provider about your diet, exercise habits, and other lifestyle factors that may be contributing to hypertension.  Visit your health care provider regularly. Your health care provider can help you create and adjust your plan for managing hypertension. Will I need medicine to control my blood pressure? Your health care provider may prescribe medicine if lifestyle changes are not enough to get your blood pressure under control, and if:  Your systolic blood pressure is 130 or higher.  Your diastolic blood pressure is 80 or higher. Take medicines only as told by your health care provider. Follow the directions carefully. Blood pressure medicines must be taken as prescribed. The medicine does not work as well when you skip doses. Skipping doses also puts you at risk for problems. Contact a health care provider if:  You think you are having a reaction to medicines you have taken.  You have repeated (recurrent) headaches.  You feel dizzy.  You have swelling in your ankles.  You have trouble with your vision. Get help right away if:  You develop a severe headache or confusion.  You have unusual weakness or numbness, or you feel faint.  You have severe pain in your chest or abdomen.  You vomit repeatedly.  You have trouble breathing. Summary  Hypertension is when the force of blood pumping  through your arteries is too strong. If this condition is not controlled, it may put you at risk for serious complications.  Your personal target blood pressure may vary depending on your medical conditions, your age, and other factors. For most people, a normal blood pressure is less than 120/80.  Hypertension is managed by lifestyle changes, medicines, or both. Lifestyle changes include weight loss, eating a healthy, low-sodium diet, exercising more, and limiting alcohol. This information is not intended to replace advice given to you by your health care provider. Make sure you discuss any questions you have with your health care provider. Document Released: 06/16/2012 Document Revised: 01/14/2019 Document Reviewed: 08/20/2016 Elsevier Patient Education  2020 Else[employer] Inc[employer]  

## 2019-11-14 ENCOUNTER — Other Ambulatory Visit: Payer: Self-pay | Admitting: Cardiology

## 2019-11-14 DIAGNOSIS — I10 Essential (primary) hypertension: Secondary | ICD-10-CM

## 2019-11-15 ENCOUNTER — Ambulatory Visit: Payer: Managed Care, Other (non HMO) | Admitting: Cardiology

## 2019-12-12 ENCOUNTER — Ambulatory Visit: Payer: Managed Care, Other (non HMO) | Admitting: Cardiology

## 2019-12-14 ENCOUNTER — Other Ambulatory Visit: Payer: Self-pay

## 2019-12-14 ENCOUNTER — Encounter: Payer: Self-pay | Admitting: Cardiology

## 2019-12-14 ENCOUNTER — Ambulatory Visit: Payer: Managed Care, Other (non HMO) | Admitting: Cardiology

## 2019-12-14 VITALS — BP 150/110 | HR 59 | Temp 98.6°F | Ht 71.0 in | Wt 193.0 lb

## 2019-12-14 DIAGNOSIS — I10 Essential (primary) hypertension: Secondary | ICD-10-CM

## 2019-12-14 DIAGNOSIS — I493 Ventricular premature depolarization: Secondary | ICD-10-CM

## 2019-12-14 MED ORDER — HYDROCHLOROTHIAZIDE 12.5 MG PO CAPS: 12.5000 mg | ORAL_CAPSULE | Freq: Every day | ORAL | 0 refills | Status: DC

## 2019-12-14 NOTE — Progress Notes (Signed)
Primary Physician:  Mliss Sax, MD   Patient ID: Raymond Sandoval, male    DOB: 1966-07-30, 54 y.o.   MRN: [id number]  Subjective:    Chief Complaint  Patient presents with  . Frequent PVCs    4 month follow up    HPI: Raymond Sandoval  is a 54 y.o. male  with  uncontrolled type 2 diabetes, hypertension, hyperlipidemia, former tobacco use, with frequent PVC's. Since being on Metoprolol, his palpitations have improved; however, continues to have frequent PVC's by holter monitor in November 2020.   Patient has had symptoms of palpitations that he describes as flip-flopping sensation for several years, but states that since being on metoprolol, palpitations have resolved.  He is tolerating medications well and feels that he is doing well.   Patient does report being diagnosed with murmur at age 14 that he states was evaluated by the [employer] and he reports that went away with exercise. Echo in March 2020 revealed mild MR, but otherwise no significant valvular abnormality.  Patient did previously exercise regularly and was training for 1/2 iron man; however, has not been exercising over the last year. States that his diet could be better.   He is a former smoker that quit for 20 years  Past Medical History:  Diagnosis Date  . Appendicitis   . Diabetes (HCC) 2006  . Hyperlipidemia   . Hypertension     Past Surgical History:  Procedure Laterality Date  . APPENDECTOMY  1975  . VASECTOMY    . WISDOM TOOTH EXTRACTION      Social History   Socioeconomic History  . Marital status: Married    Spouse name: Not on file  . Number of children: 1  . Years of education: Not on file  . Highest education level: Not on file  Occupational History  . Occupation: Air traffic controller: [employer]  Tobacco Use  . Smoking status: Former Smoker    Packs/day: 0.10    Years: 12.00    Pack years: 1.20    Types: Cigarettes    Quit date: 1990    Years since quitting: 31.2  . Smokeless  tobacco: Former Neurosurgeon    Types: Chew  . Tobacco comment: Social Only  Substance and Sexual Activity  . Alcohol use: Yes    Alcohol/week: 0.0 standard drinks    Comment: rare  . Drug use: No  . Sexual activity: Yes    Partners: Female  Other Topics Concern  . Not on file  Social History Narrative   1 child -- boy.   Social Determinants of Health   Financial Resource Strain:   . Difficulty of Paying Living Expenses: Not on file  Food Insecurity:   . Worried About Programme researcher, broadcasting/film/video in the Last Year: Not on file  . Ran Out of Food in the Last Year: Not on file  Transportation Needs:   . Lack of Transportation (Medical): Not on file  . Lack of Transportation (Non-Medical): Not on file  Physical Activity:   . Days of Exercise per Week: Not on file  . Minutes of Exercise per Session: Not on file  Stress:   . Feeling of Stress : Not on file  Social Connections:   . Frequency of Communication with Friends and Family: Not on file  . Frequency of Social Gatherings with Friends and Family: Not on file  . Attends Religious Services: Not on file  . Active Member of Clubs or Organizations:  Not on file  . Attends Banker Meetings: Not on file  . Marital Status: Not on file  Intimate Partner Violence:   . Fear of Current or Ex-Partner: Not on file  . Emotionally Abused: Not on file  . Physically Abused: Not on file  . Sexually Abused: Not on file    Review of Systems  Constitution: Negative for decreased appetite, malaise/fatigue, weight gain and weight loss.  Eyes: Negative for visual disturbance.  Cardiovascular: Positive for dyspnea on exertion and palpitations. Negative for chest pain, claudication, leg swelling, orthopnea and syncope.  Respiratory: Negative for hemoptysis and wheezing.   Endocrine: Negative for cold intolerance and heat intolerance.  Hematologic/Lymphatic: Does not bruise/bleed easily.  Skin: Negative for nail changes.  Musculoskeletal: Negative  for muscle weakness and myalgias.  Gastrointestinal: Negative for abdominal pain, change in bowel habit, nausea and vomiting.  Neurological: Negative for difficulty with concentration, dizziness, focal weakness and headaches.  Psychiatric/Behavioral: Negative for altered mental status and suicidal ideas.  All other systems reviewed and are negative.     Objective:  Blood pressure (!) 150/110, pulse (!) 59, temperature 98.6 F (37 C), height 5\' 11"  (1.803 m), weight 193 lb (87.5 kg), SpO2 99 %. Body mass index is 26.92 kg/m.    Physical Exam  Constitutional: He is oriented to person, place, and time. Vital signs are normal. He appears well-developed and well-nourished.  HENT:  Head: Normocephalic and atraumatic.  Cardiovascular: Normal rate, regular rhythm and intact distal pulses. Frequent extrasystoles are present.  Murmur heard.  Early systolic murmur is present with a grade of 2/6 at the upper right sternal border. Pulmonary/Chest: Effort normal and breath sounds normal. No accessory muscle usage. No respiratory distress.  Abdominal: Soft. Bowel sounds are normal.  Musculoskeletal:        General: Normal range of motion.     Cervical back: Normal range of motion.  Neurological: He is alert and oriented to person, place, and time.  Skin: Skin is warm and dry.  Vitals reviewed.  Radiology: No results found.  Laboratory examination:    CMP Latest Ref Rng & Units 09/19/2019 03/07/2016 12/04/2015  Glucose 70 - 99 mg/dL 12/06/2015) 244(W) 102(V)  BUN 6 - 23 mg/dL 11 16 9   Creatinine 0.40 - 1.50 mg/dL 253(G 6.44  Sodium 135 - 145 mEq/L 138 138 141  Potassium 3.5 - 5.1 mEq/L 4.0 3.8 4.2  Chloride 96 - 112 mEq/L 103 101 104  CO2 19 - 32 mEq/L 24 26 30   Calcium 8.4 - 10.5 mg/dL 9.8 9.4 9.6  Total Protein 6.0 - 8.3 g/dL 7.4 - -  Total Bilirubin 0.2 - 1.2 mg/dL 0.6 - -  Alkaline Phos 39 - 117 U/L 79 - -  AST 0 - 37 U/L 25 - -  ALT 0 - 53 U/L 39 - -   CBC Latest Ref Rng & Units  09/19/2019 03/20/2015  WBC 4.0 - 10.5 K/uL 7.8 7.4  Hemoglobin 13.0 - 17.0 g/dL 12.9(L)  Hematocrit 39.0 - 52.0 % 43.9 37.3(L)  Platelets 150.0 - 400.0 K/uL 229.0 201.0   Lipid Panel     Component Value Date/Time   CHOL 172 09/19/2019 1018   TRIG 131.0 09/19/2019 1018   HDL 41.10 09/19/2019 1018   CHOLHDL 4 09/19/2019 1018   VLDL 26.2 09/19/2019 1018   LDLCALC 105 (H) 09/19/2019 1018   LDLDIRECT 109.0 09/19/2019 1018   HEMOGLOBIN A1C Lab Results  Component Value Date   HGBA1C 7.8 (  H) 03/07/2016   TSH Recent Labs    09/19/19 1018  TSH 1.90    PRN Meds:. Medications Discontinued During This Encounter  Medication Reason  . glimepiride (AMARYL) 2 MG tablet Change in therapy  . metFORMIN (GLUCOPHAGE) 500 MG tablet Error  . losartan (COZAAR) 100 MG tablet Discontinued by provider   Current Meds  Medication Sig  . amLODipine (NORVASC) 10 MG tablet Take 1 tablet (10 mg total) by mouth daily.  Marland Kitchen atorvastatin (LIPITOR) 20 MG tablet Take 1 tablet (20 mg total) by mouth daily.  . Cholecalciferol (D3-1000) 25 MCG (1000 UT) capsule Take 1,000 Units by mouth daily.  Marland Kitchen JANUMET XR 50-1000 MG TB24 Take 1 tablet by mouth daily.  Marland Kitchen JARDIANCE 25 MG TABS tablet Take 25 mg by mouth daily.  . metoprolol succinate (TOPROL-XL) 50 MG 24 hr tablet Take 1 tablet (50 mg total) by mouth daily.  . tadalafil (CIALIS) 5 MG tablet Take 1 tablet (5 mg total) by mouth daily as needed for erectile dysfunction.  . [DISCONTINUED] losartan (COZAAR) 100 MG tablet Take 100 mg by mouth daily.    Cardiac Studies:   24-hour Holter monitor 08/15/2019: Normal sinus rhythm. Frequent PVCs (19.6%) including bigeminal and trigeminal patterns. One episode of atrial tachycardia at 2013 for 5 beats. No reported symptoms. Minimum heart rate 50 bpm. No A. fib or SVT was noted.  3 day Holter monitor 11/01/2018: Sinus rhythm with frequent PVC's (21.6%). 2 runs of atrial tachycardia on day 3 and 4. Min HR 45 bpm and  Max HR 128 bpm. No A fib or flutter noted.   Exercise sestamibi stress test 11/22/2018: 1. The patient performed treadmill exercise using Bruce protocol, completing 7:16 minutes. The patient completed an estimated workload of 8.9 METS, reaching 99% of the maximum predicted heart rate. Exercise capacity was low normal. Hemodynamic response was normal. Stress symptoms included fatigue. No ischemic changes seen on stress electrocardiogram. Frequent PVC's seen both at rest and during exercise. 2. The overall quality of the study is good. There is no evidence of abnormal lung activity. Stress and rest SPECT images demonstrate homogeneous tracer distribution throughout the myocardium. Gated SPECT imaging reveals normal myocardial thickening and wall motion. The left ventricular ejection fraction was normal (50%).  3. Intermediate risk study due to frequent PVC's. No SPECT evidence of ischemia or infarction.  Echocardiogram 12/06/2018: Left ventricle cavity is normal in size. Mild concentric hypertrophy of the left ventricle. Normal global wall motion. Normal diastolic filling pattern. Calculated EF 55%. Left atrial cavity is mildly dilated. Mild (Grade I) mitral regurgitation. Inadequate TR jet to estimate pulmonary artery systolic pressure. IVC is dilated with respiratory variation. This may suggest elevated right heart pressure. Frequent PVC's seen throughout the study.  Assessment:   Frequent PVCs - Plan: EKG 13-KGMW, Basic metabolic panel, Basic metabolic panel  Essential hypertension - Plan: Basic metabolic panel, Basic metabolic panel  EKG 08/02/2535: Normal sinus rhythm at 69 bpm, with frequent PVC's, normal axis, no evidence of ischemia.   Recommendations:   Patient is here for follow up on PVC's and hypertension. He continues to have frequent PVC's, but is asymptomatic. He is feeling well. His blood pressure is uncontrolled. Hopefully with better blood pressure control, PVC's will  also improve. I will add hydrochlorothiazide 12.5 mg daily. If he tolerates, will combine with Losartan. Will obtain BMP in 2 weeks for surveillance of kidney function. I have encouraged him to start regular exercise and follow low sodium diet to help with  better blood pressure control. Encouraged him to start checking BP at home and to keep home diary to bring to his next appt. Will plan to see him back in 4 weeks for follow up on HTN and PVC's.  Toniann Fail, MSN, APRN, FNP-C [facility] Office: [phone] Fax: [phone]

## 2019-12-19 ENCOUNTER — Ambulatory Visit: Payer: Managed Care, Other (non HMO) | Admitting: Family Medicine

## 2019-12-23 ENCOUNTER — Other Ambulatory Visit: Payer: Self-pay

## 2019-12-23 MED ORDER — LOSARTAN POTASSIUM-HCTZ 100-12.5 MG PO TABS: 1.0000 | ORAL_TABLET | Freq: Every day | ORAL | 0 refills | Status: DC

## 2019-12-30 LAB — BASIC METABOLIC PANEL
BUN/Creatinine Ratio: 15 (ref 9–20)
BUN: 17 mg/dL (ref 6–24)
CO2: 21 mmol/L (ref 20–29)
Calcium: 10.1 mg/dL (ref 8.7–10.2)
Chloride: 100 mmol/L (ref 96–106)
Creatinine, Ser: 1.12 mg/dL (ref 0.76–1.27)
GFR calc Af Amer: 87 mL/min/{1.73_m2} (ref >59)
GFR calc non Af Amer: 75 mL/min/{1.73_m2} (ref >59)
Glucose: 174 mg/dL — ABNORMAL HIGH (ref 65–99)
Potassium: 4.3 mmol/L (ref 3.5–5.2)
Sodium: 139 mmol/L (ref 134–144)

## 2020-01-02 ENCOUNTER — Telehealth: Payer: Self-pay

## 2020-01-02 NOTE — Telephone Encounter (Signed)
-----   Message from Yates Decamp, MD sent at 12/30/2019  9:17 AM EDT ----- Normal kidney function, blood sugar elevated

## 2020-01-02 NOTE — Telephone Encounter (Signed)
Called and spoke with patient regarding his most recent bloodwork results.

## 2020-01-23 ENCOUNTER — Ambulatory Visit: Payer: Managed Care, Other (non HMO) | Admitting: Cardiology

## 2020-02-06 ENCOUNTER — Ambulatory Visit: Payer: Managed Care, Other (non HMO) | Admitting: Family Medicine

## 2020-02-16 ENCOUNTER — Telehealth: Payer: Self-pay | Admitting: Family Medicine

## 2020-02-16 NOTE — Telephone Encounter (Signed)
LVM: Called patient to reschedule appt cancelled from 02/06/20.

## 2020-02-23 ENCOUNTER — Ambulatory Visit: Payer: Managed Care, Other (non HMO) | Admitting: Cardiology

## 2020-04-03 ENCOUNTER — Other Ambulatory Visit: Payer: Self-pay | Admitting: Cardiology

## 2020-05-22 ENCOUNTER — Other Ambulatory Visit: Payer: Self-pay | Admitting: Cardiology

## 2020-05-27 DIAGNOSIS — E782 Mixed hyperlipidemia: Secondary | ICD-10-CM | POA: Insufficient documentation

## 2020-05-27 NOTE — Progress Notes (Signed)
Follow up visit  Subjective:   Raymond Sandoval, male    DOB: 1966/08/22, 54 y.o.   MRN: [id number]   HPI  Chief Complaint  Patient presents with  . PVCs  . Follow-up    54 y/o Caucasian male with hypertension, hyperlipidemia, type 2 DM, former smoker, frequent PVC's.  Patient was last seen by Jeri Lager, [place] in 12/2019. His PVC's have improved, judging by lack of palpitations symptoms. Blood pressure is elevated today, reportedly lower on home checks.  He denies chest pain, shortness of breath, palpitations, leg edema, orthopnea, PND, TIA/syncope.  Current Outpatient Medications on File Prior to Visit  Medication Sig Dispense Refill  . amLODipine (NORVASC) 10 MG tablet Take 1 tablet (10 mg total) by mouth daily. 90 tablet 3  . atorvastatin (LIPITOR) 20 MG tablet Take 1 tablet (20 mg total) by mouth daily. 90 tablet 3  . JANUMET XR 50-1000 MG TB24 Take 1 tablet by mouth daily.    Marland Kitchen JARDIANCE 25 MG TABS tablet Take 25 mg by mouth daily.    Marland Kitchen losartan-hydrochlorothiazide (HYZAAR) 100-12.5 MG tablet TAKE 1 TABLET BY MOUTH ONCE DAILY . APPOINTMENT REQUIRED FOR FUTURE REFILLS 30 tablet 0  . metoprolol succinate (TOPROL-XL) 50 MG 24 hr tablet Take 1 tablet (50 mg total) by mouth daily. 90 tablet 3  . tadalafil (CIALIS) 5 MG tablet Take 1 tablet (5 mg total) by mouth daily as needed for erectile dysfunction. 12 tablet 3   No current facility-administered medications on file prior to visit.    Cardiovascular & other pertient studies:   EKG 12/14/2019: Sinus rhythm, frequent PVC's  Echocardiogram 12/06/2018:  Left ventricle cavity is normal in size. Mild concentric hypertrophy of  the left ventricle. Normal global wall motion. Normal diastolic filling  pattern. Calculated EF 55%.  Left atrial cavity is mildly dilated.  Mild (Grade I) mitral regurgitation.  Inadequate TR jet to estimate pulmonary artery systolic pressure.  IVC is dilated with respiratory variation. This may  suggest elevated right  heart pressure.  Frequent PVC's seen throughout the study.   Exercise sestamibi stress test 11/22/2018:  1. The patient performed treadmill exercise using Bruce protocol, completing 7:16 minutes. The patient completed an estimated workload of 8.9 METS, reaching 99% of the maximum predicted heart rate. Exercise capacity was low normal. Hemodynamic response was normal. Stress symptoms included fatigue.  No ischemic changes seen on stress electrocardiogram. Frequent PVC's seen both at rest and during exercise. 2. The overall quality of the study is good. There is no evidence of abnormal lung activity. Stress and rest SPECT images demonstrate homogeneous tracer distribution throughout the myocardium. Gated SPECT imaging reveals normal myocardial thickening and wall motion. The left ventricular ejection fraction was normal (50%).   3. Intermediate risk study due to frequent PVC's. No SPECT evidence of ischemia or infarction.   24-hour Holter monitor 08/15/2019:  Normal sinus rhythm. Frequent PVCs (19.6%) including bigeminal and trigeminal patterns. One episode of atrial tachycardia at 2013 for 5 beats. No reported symptoms. Minimum heart rate 50 bpm. No A. fib or SVT was noted.  Recent labs: 12/29/2019: Glucose 174, BUN/Cr 17/1.12. EGFR 75. Na/K 139/4.3.   09/2019: H/H 15/43. MCV 83. Platelets 229 Chol 172, TG 131, HDL 41, LDL 109 TSH 1.9 normal  2017: HbA1C 7.8%    Review of Systems  Cardiovascular: Negative for chest pain, dyspnea on exertion, leg swelling, palpitations and syncope.         Vitals:   05/28/20 0840 05/28/20  0841  BP: (!) 172/107 (!) 150/84  Pulse: 66 (!) 57  Resp: 17   SpO2: 100%      Body mass index is 26.22 kg/m. Filed Weights   05/28/20 0840  Weight: 188 lb (85.3 kg)     Objective:   Physical Exam Vitals and nursing note reviewed.  Constitutional:      General: He is not in acute distress. Neck:     Vascular: No JVD.   Cardiovascular:     Rate and Rhythm: Normal rate and regular rhythm.     Heart sounds: Normal heart sounds. No murmur heard.   Pulmonary:     Effort: Pulmonary effort is normal.     Breath sounds: Normal breath sounds. No wheezing or rales.           Assessment & Recommendations:   54 y/o Caucasian male with hypertension, hyperlipidemia, type 2 DM, former smoker, frequent PVC's.  Frequent PVC's: Resolved with metoprolol succinate 50 mg daily.  Hypertension: BP elevated today. Reportedly lower at home. Encouraged him to keep a log of home readings, take the log and his monitor to his upcoming nephrology appt on 8/31.  Hyperlipidemia: LDL remains >100. Increased lipitor to 40 mg daily. Repeat lipid panel in 6 months  Type 2 DM: Managed by PCP  F/u in 6 months    Nigel Mormon, MD Pager: [phone] Office: [phone]

## 2020-05-28 ENCOUNTER — Ambulatory Visit: Payer: Managed Care, Other (non HMO) | Admitting: Cardiology

## 2020-05-28 ENCOUNTER — Other Ambulatory Visit: Payer: Self-pay

## 2020-05-28 ENCOUNTER — Encounter: Payer: Self-pay | Admitting: Cardiology

## 2020-05-28 VITALS — BP 150/84 | HR 57 | Resp 17 | Ht 71.0 in | Wt 188.0 lb

## 2020-05-28 DIAGNOSIS — E7800 Pure hypercholesterolemia, unspecified: Secondary | ICD-10-CM

## 2020-05-28 DIAGNOSIS — I10 Essential (primary) hypertension: Secondary | ICD-10-CM

## 2020-05-28 DIAGNOSIS — I493 Ventricular premature depolarization: Secondary | ICD-10-CM

## 2020-05-28 DIAGNOSIS — E1165 Type 2 diabetes mellitus with hyperglycemia: Secondary | ICD-10-CM

## 2020-05-28 DIAGNOSIS — E782 Mixed hyperlipidemia: Secondary | ICD-10-CM

## 2020-05-28 MED ORDER — ATORVASTATIN CALCIUM 40 MG PO TABS: 20.0000 mg | ORAL_TABLET | Freq: Every day | ORAL | 3 refills | Status: DC

## 2020-06-27 ENCOUNTER — Other Ambulatory Visit: Payer: Self-pay | Admitting: Cardiology

## 2020-09-18 ENCOUNTER — Other Ambulatory Visit: Payer: Self-pay | Admitting: Family Medicine

## 2020-09-18 DIAGNOSIS — I10 Essential (primary) hypertension: Secondary | ICD-10-CM

## 2020-09-18 DIAGNOSIS — E7800 Pure hypercholesterolemia, unspecified: Secondary | ICD-10-CM

## 2020-12-03 ENCOUNTER — Ambulatory Visit: Payer: Managed Care, Other (non HMO) | Admitting: Cardiology

## 2020-12-10 NOTE — Progress Notes (Signed)
Follow up visit  Subjective:   Raymond Sandoval, male    DOB: September 15, 1966, 55 y.o.   MRN: [id number]   HPI  Chief Complaint  Patient presents with  . PVC  . Follow-up    55 y/o Caucasian male with hypertension, hyperlipidemia, type 2 DM, former smoker, frequent PVC's.  Patient presents for 30-monthfollow-up of hypertension, hyperlipidemia, frequent PVCs.  At last visit LDL was elevated above goal, i therefore patient was advised to increase Lipitor to 40 male grams daily, however upon medication reconciliation patient has only been taking Lipitor 20 mg daily.  Since last visit patient has been seen by endocrinologist and noted his heart rate was around 45 bpm and patient was feeling fatigued, therefore metoprolol succinate was reduced to 25 mg daily.  Otherwise patient is feeling well, denies chest pain, dyspnea, palpitations, leg edema, orthopnea, PND, TIA, syncope, near syncope.  His diabetes remains uncontrolled, he follows closely with endocrinology.  He has had very rare episodes of palpitations suggestive of PVCs.   Patient monitors his blood pressure at home stating a range 123-134/68-85 mmHg.  Current Outpatient Medications on File Prior to Visit  Medication Sig Dispense Refill  . amLODipine (NORVASC) 10 MG tablet Take 1 tablet (10 mg total) by mouth daily. 90 tablet 3  . atorvastatin (LIPITOR) 40 MG tablet Take 0.5 tablets (20 mg total) by mouth daily. 90 tablet 3  . Cholecalciferol ([employer] D3) 125 MCG (5000 UT) capsule Take 5,000 Units by mouth daily.    .Marland Kitchenglimepiride (AMARYL) 2 MG tablet Take 2 mg by mouth 2 (two) times daily.    .Marland KitchenJARDIANCE 25 MG TABS tablet Take 25 mg by mouth daily.    . metoprolol succinate (TOPROL-XL) 25 MG 24 hr tablet Take 25 mg by mouth daily.    . SitaGLIPtin-MetFORMIN HCl (JANUMET XR) 50-1000 MG TB24 1 tablet with meals    . tadalafil (CIALIS) 5 MG tablet Take 1 tablet (5 mg total) by mouth daily as needed for erectile dysfunction. 12 tablet 3  .  Zinc 50 MG CAPS Take 1 capsule by mouth daily.     No current facility-administered medications on file prior to visit.    Cardiovascular & other pertient studies: EKG 12/11/2020: Sinus rhythm at a rate of 65 bpm.  Normal axis. Nonspecific T wave abnormality.  No evidence of ischemia or underlying injury pattern.  Compared to EKG 12/14/2019, no PVCs noted otherwise no significant change.  EKG 12/14/2019: Sinus rhythm, frequent PVC's  Echocardiogram 12/06/2018:  Left ventricle cavity is normal in size. Mild concentric hypertrophy of  the left ventricle. Normal global wall motion. Normal diastolic filling  pattern. Calculated EF 55%.  Left atrial cavity is mildly dilated.  Mild (Grade I) mitral regurgitation.  Inadequate TR jet to estimate pulmonary artery systolic pressure.  IVC is dilated with respiratory variation. This may suggest elevated right  heart pressure.  Frequent PVC's seen throughout the study.   Exercise sestamibi stress test 11/22/2018:  1. The patient performed treadmill exercise using Bruce protocol, completing 7:16 minutes. The patient completed an estimated workload of 8.9 METS, reaching 99% of the maximum predicted heart rate. Exercise capacity was low normal. Hemodynamic response was normal. Stress symptoms included fatigue.  No ischemic changes seen on stress electrocardiogram. Frequent PVC's seen both at rest and during exercise. 2. The overall quality of the study is good. There is no evidence of abnormal lung activity. Stress and rest SPECT images demonstrate homogeneous tracer distribution throughout the  myocardium. Gated SPECT imaging reveals normal myocardial thickening and wall motion. The left ventricular ejection fraction was normal (50%).   3. Intermediate risk study due to frequent PVC's. No SPECT evidence of ischemia or infarction.   24-hour Holter monitor 08/15/2019:  Normal sinus rhythm. Frequent PVCs (19.6%) including bigeminal and trigeminal patterns. One  episode of atrial tachycardia at 2013 for 5 beats. No reported symptoms. Minimum heart rate 50 bpm. No A. fib or SVT was noted.  Recent labs: 09/24/2020: Glucose 281, sodium 138, potassium 5.1, BUN 12, creatinine 0.84, GFR 100 Total cholesterol 206, triglycerides 285, HDL 40, LDL 116 A1c 9.1%  12/29/2019: Glucose 174, BUN/Cr 17/1.12. EGFR 75. Na/K 139/4.3.   09/2019: H/H 15/43. MCV 83. Platelets 229 Chol 172, TG 131, HDL 41, LDL 109 TSH 1.9 normal  2017: HbA1C 7.8%    Review of Systems  Constitutional: Negative for malaise/fatigue and weight gain.  Cardiovascular: Negative for chest pain, claudication, dyspnea on exertion, leg swelling, near-syncope, orthopnea, palpitations, paroxysmal nocturnal dyspnea and syncope.  Respiratory: Negative for shortness of breath.   Hematologic/Lymphatic: Does not bruise/bleed easily.  Gastrointestinal: Negative for melena.  Neurological: Negative for dizziness and weakness.         Vitals:   12/11/20 0832 12/11/20 0838  BP: (!) 158/113 (!) 148/103  Pulse: 66 62  Temp: 98.2 F (36.8 C)   SpO2: 99% 100%     Body mass index is 27.62 kg/m. Filed Weights   12/11/20 0832  Weight: 198 lb (89.8 kg)     Objective:   Physical Exam Vitals and nursing note reviewed.  Constitutional:      General: He is not in acute distress. HENT:     Head: Normocephalic and atraumatic.  Neck:     Vascular: No JVD.  Cardiovascular:     Rate and Rhythm: Normal rate and regular rhythm.     Pulses: Intact distal pulses.     Heart sounds: Normal heart sounds, S1 normal and S2 normal. No murmur heard. No gallop.   Pulmonary:     Effort: Pulmonary effort is normal. No respiratory distress.     Breath sounds: Normal breath sounds. No wheezing, rhonchi or rales.  Abdominal:     General: Bowel sounds are normal. There is no distension.     Palpations: Abdomen is soft.  Musculoskeletal:     Right lower leg: No edema.     Left lower leg: No edema.   Neurological:     Mental Status: He is alert.           Assessment & Recommendations:   55 y/o Caucasian male with hypertension, hyperlipidemia, type 2 DM, former smoker, frequent PVC's.  Frequent PVC's:  Patient has rare episodes of palpitations suggestive of PVCs since metoprolol succinate was reduced from 50 mg to 25 mg daily due to bradycardia with associated fatigue.  Patient states his symptoms are tolerable. Continue metoprolol succinate 25 mg daily.  Hypertension: Blood pressure is elevated in the office today, however patient reports lower blood pressure readings at home.  Home blood pressure readings remain borderline elevated, and in view of his uncontrolled diabetes would prefer to improve blood pressure control. We will continue losartan/hydrochlorothiazide, amlodipine, and metoprolol succinate at 25 mg daily.  Prefer to avoid increasing metoprolol as patient had symptomatic bradycardia with 50 mg daily. We will add hydralazine 25 mg twice daily. Patient will continue to monitor his blood pressure on a daily basis and keep a written log, he will notify our  office if blood pressure remains elevated above goal of <130/80 mmHg.  He will also notify our office if he experiences any side effects with hydralazine.  Hyperlipidemia: Lipids remain above goal, although patient has only been taking Lipitor 20 mg daily instead of the previously advised 40 mg daily. Will increase Lipitor from 20 mg to 40 mg daily.  Patient will have lipid profile testing done in late April with endocrinologist Dr. Chalmers Sandoval Could consider addition of Zetia in the future if necessary.  Type 2 diabetes:  Management per endocrinology. Last A1c 9.1%, diabetes remains uncontrolled.  I personally reviewed external labs.  We will send visit note to patient's [employer] provider.  Follow-up in 6 months, sooner if needed, for hypertension, hyperlipidemia, and PVCs.   Alethia Berthold,  PA-C 12/11/2020, 9:29 AM Office: [phone]

## 2020-12-11 ENCOUNTER — Other Ambulatory Visit: Payer: Self-pay

## 2020-12-11 ENCOUNTER — Ambulatory Visit: Payer: 59 | Admitting: Student

## 2020-12-11 ENCOUNTER — Encounter: Payer: Self-pay | Admitting: Student

## 2020-12-11 VITALS — BP 148/103 | HR 62 | Temp 98.2°F | Ht 71.0 in | Wt 198.0 lb

## 2020-12-11 DIAGNOSIS — E1165 Type 2 diabetes mellitus with hyperglycemia: Secondary | ICD-10-CM

## 2020-12-11 DIAGNOSIS — I10 Essential (primary) hypertension: Secondary | ICD-10-CM

## 2020-12-11 DIAGNOSIS — E782 Mixed hyperlipidemia: Secondary | ICD-10-CM

## 2020-12-11 DIAGNOSIS — I493 Ventricular premature depolarization: Secondary | ICD-10-CM

## 2020-12-11 MED ORDER — LOSARTAN POTASSIUM-HCTZ 100-25 MG PO TABS: 1.0000 | ORAL_TABLET | Freq: Every day | ORAL | 3 refills | Status: DC

## 2020-12-11 MED ORDER — HYDRALAZINE HCL 25 MG PO TABS: 25.0000 mg | ORAL_TABLET | Freq: Two times a day (BID) | ORAL | 3 refills | Status: DC

## 2021-01-10 ENCOUNTER — Other Ambulatory Visit: Payer: Self-pay | Admitting: Family Medicine

## 2021-01-10 DIAGNOSIS — N5201 Erectile dysfunction due to arterial insufficiency: Secondary | ICD-10-CM

## 2021-04-08 ENCOUNTER — Other Ambulatory Visit: Payer: Self-pay | Admitting: Student

## 2021-05-17 ENCOUNTER — Other Ambulatory Visit: Payer: Self-pay

## 2021-05-17 MED ORDER — HYDRALAZINE HCL 25 MG PO TABS: 25.0000 mg | ORAL_TABLET | Freq: Two times a day (BID) | ORAL | 3 refills | Status: DC

## 2021-05-17 MED ORDER — METOPROLOL SUCCINATE ER 25 MG PO TB24: 25.0000 mg | ORAL_TABLET | Freq: Every day | ORAL | 3 refills | Status: AC

## 2021-06-17 NOTE — Progress Notes (Signed)
Follow up visit  Subjective:   Raymond Sandoval, male    DOB: [birth year], 55 y.o.   MRN: [id number]   HPI  Chief Complaint  Patient presents with   Hypertension   Follow-up    55 y/o Caucasian male with hypertension, hyperlipidemia, type 2 DM, former smoker, frequent PVC's.  Patient presents for 62-monthfollow-up of hypertension, hyperlipidemia, and frequent PVCs.  At last office visit added hydralazine 25 mg twice daily given uncontrolled hypertension.  Also last visit increased Lipitor from 20 mg to 40 mg daily.  Patient has been feeling well overall without specific complaints today.  He is walking approximately 1 mile daily without issue.  Denies chest pain, dyspnea, palpitations, leg edema, orthopnea, PND.  Patient's diabetes is not well controlled.  Lipids are now well controlled.  In regard to hypertension it remains uncontrolled.  Patient admits to high sodium intake.  Family History  Problem Relation Age of Onset   Hypertension Mother        Liivng   Diabetes Mother    Heart disease Mother    Arthritis Mother    Hypertension Father 634      Deceased   Kidney failure Father    Diabetes Father    Heart disease Father    Diabetes Paternal Grandfather    Cancer Paternal Grandfather    Heart disease Other        Grandparents   Diabetes Paternal Aunt        x2   Heart disease Other        Paterna.l Aunts & Uncles   Healthy Brother        x1   Healthy Son        x1   Past Surgical History:  Procedure Laterality Date   APPENDECTOMY  1975   VASECTOMY     WISDOM TOOTH EXTRACTION     Social History   Tobacco Use   Smoking status: Former    Packs/day: 0.10    Years: 12.00    Pack years: 1.20    Types: Cigarettes    Quit date: 1990    Years since quitting: 32.7   Smokeless tobacco: Former    Types: Chew   Tobacco comments:    Social Only  Substance Use Topics   Alcohol use: Yes    Alcohol/week: 0.0 standard drinks    Comment: rare   Past Medical  History:  Diagnosis Date   Appendicitis    [place]   Hyperlipidemia    Hypertension    Allergies  Allergen Reactions   Codeine Nausea Only and Rash   Penicillins Anaphylaxis   Hydrocodone Nausea Only and Rash    Current Outpatient Medications on File Prior to Visit  Medication Sig Dispense Refill   amLODipine (NORVASC) 10 MG tablet Take 1 tablet (10 mg total) by mouth daily. 90 tablet 3   atorvastatin (LIPITOR) 20 MG tablet Take 20 mg by mouth daily.     Cholecalciferol (VITAMIN D3) 25 MCG (1000 UT) CAPS Take 5,000 Units by mouth.     Continuous Blood Gluc Transmit (DEXCOM G6 TRANSMITTER) MISC one transmitter     JARDIANCE 25 MG TABS tablet Take 25 mg by mouth daily.     losartan-hydrochlorothiazide (HYZAAR) 100-25 MG tablet Take 1 tablet by mouth daily. 90 tablet 3   metoprolol succinate (TOPROL-XL) 25 MG 24 hr tablet Take 1 tablet (25 mg total) by mouth daily. 30 tablet 3   Multiple Vitamins-Minerals (MULTIVITAMIN  WITH MINERALS) tablet Take 1 tablet by mouth daily.     SitaGLIPtin-MetFORMIN HCl (JANUMET XR) 50-1000 MG TB24 1 tablet with meals     tadalafil (CIALIS) 5 MG tablet TAKE 1 TABLET BY MOUTH ONCE DAILY AS NEEDED FOR ERECTILE DYSFUNCTION 12 tablet 0   No current facility-administered medications on file prior to visit.    Cardiovascular & other pertient studies: EKG 06/18/2021:  Sinus rhythm at a rate of 70 bpm.  Normal axis.  Nonspecific T wave abnormality.  No evidence of ischemia or underlying injury pattern.  EKG 12/11/2020: Sinus rhythm at a rate of 65 bpm.  Normal axis. Nonspecific T wave abnormality.  No evidence of ischemia or underlying injury pattern.  Compared to EKG 12/14/2019, no PVCs noted otherwise no significant change.  EKG 12/14/2019: Sinus rhythm, frequent PVC's  Echocardiogram 12/06/2018:  Left ventricle cavity is normal in size. Mild concentric hypertrophy of  the left ventricle. Normal global wall motion. Normal diastolic filling   pattern. Calculated EF 55%.  Left atrial cavity is mildly dilated.  Mild (Grade I) mitral regurgitation.  Inadequate TR jet to estimate pulmonary artery systolic pressure.  IVC is dilated with respiratory variation. This may suggest elevated right  heart pressure.  Frequent PVC's seen throughout the study.   Exercise sestamibi stress test 11/22/2018:  1. The patient performed treadmill exercise using Bruce protocol, completing 7:16 minutes. The patient completed an estimated workload of 8.9 METS, reaching 99% of the maximum predicted heart rate. Exercise capacity was low normal. Hemodynamic response was normal. Stress symptoms included fatigue.  No ischemic changes seen on stress electrocardiogram. Frequent PVC's seen both at rest and during exercise. 2. The overall quality of the study is good. There is no evidence of abnormal lung activity. Stress and rest SPECT images demonstrate homogeneous tracer distribution throughout the myocardium. Gated SPECT imaging reveals normal myocardial thickening and wall motion. The left ventricular ejection fraction was normal (50%).   3. Intermediate risk study due to frequent PVC's. No SPECT evidence of ischemia or infarction.   24-hour Holter monitor 08/15/2019:  Normal sinus rhythm.  Frequent PVCs (19.6%) including bigeminal and trigeminal patterns.  One episode of atrial tachycardia at 2013 for 5 beats.  No reported symptoms.  Minimum heart rate 50 bpm.  No A. fib or SVT was noted.  Recent labs: CMP Latest Ref Rng & Units 12/29/2019 09/19/2019 03/07/2016  Glucose 65 - 99 mg/dL 174(H) 175(H) 181(H)  BUN 6 - 24 mg/dL _0 Creatinine 0.76 - 1.27 mg/dL 1.12 0.92 0.89  Sodium 134 - 144 mmol/L 139 138 138  Potassium 3.5 - 5.2 mmol/L 4.3 4.0 3.8  Chloride 96 - 106 mmol/L 100 103 101  CO2 20 - 29 mmol/L _1 Calcium 8.7 - 10.2 mg/dL 10.1 9.8 9.4  Total Protein 6.0 - 8.3 g/dL - 7.4 -  Total Bilirubin 0.2 - 1.2 mg/dL - 0.6 -  Alkaline Phos 39 - 117  U/L - 79 -  AST 0 - 37 U/L - 25 -  ALT 0 - 53 U/L - 39 -   CBC Latest Ref Rng & Units 09/19/2019 03/20/2015  WBC 4.0 - 10.5 K/uL 7.8 7.4  Hemoglobin 13.0 - 17.0 g/dL 15.0 12.9(L)  Hematocrit 39.0 - 52.0 % 43.9 37.3(L)  Platelets 150.0 - 400.0 K/uL 229.0 201.0   Lipid Panel     Component Value Date/Time   CHOL 172 09/19/2019 1018   TRIG 131.0 09/19/2019 1018   HDL 41.10 09/19/2019  1018   CHOLHDL 4 09/19/2019 1018   VLDL 26.2 09/19/2019 1018   LDLCALC 105 (H) 09/19/2019 1018   LDLDIRECT 109.0 09/19/2019 1018   HEMOGLOBIN A1C Lab Results  Component Value Date   HGBA1C 7.8 (H) 03/07/2016   TSH No results for input(s): TSH in the last 8760 hours.  External labs: 02/19/2021: Sodium 139, potassium 4.1, BUN 14, creatinine 0.93, GFR >60 A1c 7.2% Total cholesterol 152, triglycerides 154, HDL 41, LDL 80   09/24/2020: Glucose 281, sodium 138, potassium 5.1, BUN 12, creatinine 0.84, GFR 100 Total cholesterol 206, triglycerides 285, HDL 40, LDL 116 A1c 9.1%  12/29/2019: Glucose 174, BUN/Cr 17/1.12. EGFR 75. Na/K 139/4.3.   09/2019: H/H 15/43. MCV 83. Platelets 229 Chol 172, TG 131, HDL 41, LDL 109 TSH 1.9 normal  2017: HbA1C 7.8%    Review of Systems  Cardiovascular:  Negative for chest pain, claudication, dyspnea on exertion, leg swelling, near-syncope, orthopnea, palpitations, paroxysmal nocturnal dyspnea and syncope.  Respiratory:  Negative for shortness of breath.   Hematologic/Lymphatic: Does not bruise/bleed easily.  Neurological:  Negative for dizziness.        Vitals:   06/18/21 0840 06/18/21 0846  BP: (!) 163/102 (!) 146/98  Pulse: 76 72  Temp: 98.2 F (36.8 C)   SpO2: 98% 99%     Body mass index is 26.64 kg/m. Filed Weights   06/18/21 0840  Weight: 191 lb (86.6 kg)     Objective:   Physical Exam Vitals and nursing note reviewed.  Constitutional:      General: He is not in acute distress. HENT:     Head: Normocephalic and atraumatic.   Neck:     Vascular: No JVD.  Cardiovascular:     Rate and Rhythm: Normal rate and regular rhythm.     Pulses: Intact distal pulses.     Heart sounds: Normal heart sounds, S1 normal and S2 normal. No murmur heard.   No gallop.  Pulmonary:     Effort: Pulmonary effort is normal. No respiratory distress.     Breath sounds: Normal breath sounds. No wheezing, rhonchi or rales.  Abdominal:     General: Bowel sounds are normal. There is no distension.     Palpations: Abdomen is soft.     Hernia: A hernia (ventral) is present.  Musculoskeletal:     Right lower leg: No edema.     Left lower leg: No edema.  Neurological:     Mental Status: He is alert.       Assessment & Recommendations:   55 y/o Caucasian male with hypertension, hyperlipidemia, type 2 DM, former smoker, frequent PVC's.  Hypertension: Blood pressure remains uncontrolled. Increase hydralazine from 25 mg to 50 mg twice daily.  Discussed with patient option of 3 times daily dosing, however he was resistant to this.  Continue amlodipine, metoprolol, losartan/hydrochlorothiazide. Counseled patient regarding importance of reducing sodium intake, patient appears motivated.  Patient like to hold off on additional medication adjustments and instead focus on making diet and lifestyle changes. Advised patient to monitor his blood pressure on a daily basis and keep a written log that he will bring to his next appointment. Prefer to avoid increasing metoprolol as patient had symptomatic bradycardia with 50 mg daily.  Hyperlipidemia: I personally reviewed external labs, lipids are now well controlled Continue Lipitor 40 mg daily  Frequent PVC's: Patient has very rare episodes of palpitations, well controlled with metoprolol succinate. Continue metoprolol  Type 2 diabetes:  Management per endocrinology. Last  A1c 7.2%, diabetes control improved   Follow-up in 6 weeks sooner if needed, for hypertension.    Alethia Berthold, PA-C 06/18/2021, 9:21 AM Office: [phone]

## 2021-06-18 ENCOUNTER — Encounter: Payer: Self-pay | Admitting: Student

## 2021-06-18 ENCOUNTER — Ambulatory Visit: Payer: 59 | Admitting: Student

## 2021-06-18 ENCOUNTER — Other Ambulatory Visit: Payer: Self-pay

## 2021-06-18 VITALS — BP 146/98 | HR 72 | Temp 98.2°F | Ht 71.0 in | Wt 191.0 lb

## 2021-06-18 DIAGNOSIS — E782 Mixed hyperlipidemia: Secondary | ICD-10-CM

## 2021-06-18 DIAGNOSIS — I493 Ventricular premature depolarization: Secondary | ICD-10-CM

## 2021-06-18 DIAGNOSIS — I10 Essential (primary) hypertension: Secondary | ICD-10-CM

## 2021-06-18 MED ORDER — HYDRALAZINE HCL 50 MG PO TABS: 50.0000 mg | ORAL_TABLET | Freq: Two times a day (BID) | ORAL | 3 refills | Status: DC

## 2021-08-06 ENCOUNTER — Ambulatory Visit: Payer: 59 | Admitting: Student

## 2021-08-06 ENCOUNTER — Encounter: Payer: Self-pay | Admitting: Student

## 2021-08-06 ENCOUNTER — Other Ambulatory Visit: Payer: Self-pay

## 2021-08-06 VITALS — BP 148/95 | HR 58 | Temp 98.0°F | Resp 17 | Ht 71.0 in | Wt 190.4 lb

## 2021-08-06 DIAGNOSIS — I10 Essential (primary) hypertension: Secondary | ICD-10-CM

## 2021-08-06 DIAGNOSIS — E782 Mixed hyperlipidemia: Secondary | ICD-10-CM

## 2021-08-06 NOTE — Progress Notes (Signed)
Follow up visit  Subjective:   Raymond Sandoval, male    DOB: [birth year], 55 y.o.   MRN: [id number]   HPI  Chief Complaint  Patient presents with   Hypertension    6 weeks    55 y/o Caucasian male with hypertension, hyperlipidemia, type 2 DM, former smoker, frequent PVC's.  Patient presents for 6 week follow-up of hypertension.  At last office visit increase hydralazine from 25 mg to 50 mg p.o. twice daily.  Patient is tolerating increased dose of hydralazine without issue.  He has been monitoring blood pressure regularly at home and brings both his home monitor and log with him.  Blood pressure is elevated in the office today, however it is well controlled at home averaging 125-135/80-85 mmHg.  He has worked to reduce sodium intake in his diet.  Overall patient is feeling well.  Family History  Problem Relation Age of Onset   Heart attack Mother    Heart failure Mother 1       passed   Hypertension Mother    Diabetes Mother    Heart disease Mother    Arthritis Mother    Stroke Mother 48   Hypertension Father 90       Deceased   Kidney failure Father    Diabetes Father    Heart disease Father    Healthy Brother        x1   Diabetes Paternal Aunt        x2   Diabetes Paternal Grandfather    Cancer Paternal Grandfather    Healthy Son        x1   Heart disease Other        Grandparents   Heart disease Other        Paterna.l Aunts & Uncles   Past Surgical History:  Procedure Laterality Date   APPENDECTOMY  1975   VASECTOMY     WISDOM TOOTH EXTRACTION     Social History   Tobacco Use   Smoking status: Former    Packs/day: 0.10    Years: 12.00    Pack years: 1.20    Types: Cigarettes    Quit date: 1990    Years since quitting: 32.8   Smokeless tobacco: Former    Types: Chew   Tobacco comments:    Social Only  Substance Use Topics   Alcohol use: Yes    Alcohol/week: 0.0 standard drinks    Comment: rare   Past Medical History:  Diagnosis Date    Appendicitis    Diabetes ([place]) 2006   Hyperlipidemia    Hypertension    Allergies  Allergen Reactions   Codeine Nausea Only and Rash   Penicillins Anaphylaxis   Hydrocodone Nausea Only and Rash    Current Outpatient Medications on File Prior to Visit  Medication Sig Dispense Refill   amLODipine (NORVASC) 10 MG tablet Take 1 tablet (10 mg total) by mouth daily. 90 tablet 3   atorvastatin (LIPITOR) 20 MG tablet Take 20 mg by mouth daily.     Cholecalciferol (VITAMIN D3) 25 MCG (1000 UT) CAPS Take 5,000 Units by mouth.     Continuous Blood Gluc Transmit (DEXCOM G6 TRANSMITTER) MISC one transmitter     hydrALAZINE (APRESOLINE) 50 MG tablet Take 1 tablet (50 mg total) by mouth 2 (two) times daily. 60 tablet 3   JARDIANCE 25 MG TABS tablet Take 25 mg by mouth daily.     losartan-hydrochlorothiazide (HYZAAR) 100-25 MG tablet Take 1  tablet by mouth daily. 90 tablet 3   metoprolol succinate (TOPROL-XL) 25 MG 24 hr tablet Take 1 tablet (25 mg total) by mouth daily. 30 tablet 3   Multiple Vitamins-Minerals (MULTIVITAMIN WITH MINERALS) tablet Take 1 tablet by mouth daily.     SitaGLIPtin-MetFORMIN HCl (JANUMET XR) 50-1000 MG TB24 1 tablet with meals     tadalafil (CIALIS) 5 MG tablet TAKE 1 TABLET BY MOUTH ONCE DAILY AS NEEDED FOR ERECTILE DYSFUNCTION 12 tablet 0   No current facility-administered medications on file prior to visit.    Cardiovascular & other pertient studies: EKG 06/18/2021:  Sinus rhythm at a rate of 70 bpm.  Normal axis.  Nonspecific T wave abnormality.  No evidence of ischemia or underlying injury pattern.  EKG 12/11/2020: Sinus rhythm at a rate of 65 bpm.  Normal axis. Nonspecific T wave abnormality.  No evidence of ischemia or underlying injury pattern.  Compared to EKG 12/14/2019, no PVCs noted otherwise no significant change.  EKG 12/14/2019: Sinus rhythm, frequent PVC's  Echocardiogram 12/06/2018:  Left ventricle cavity is normal in size. Mild concentric hypertrophy of   the left ventricle. Normal global wall motion. Normal diastolic filling  pattern. Calculated EF 55%.  Left atrial cavity is mildly dilated.  Mild (Grade I) mitral regurgitation.  Inadequate TR jet to estimate pulmonary artery systolic pressure.  IVC is dilated with respiratory variation. This may suggest elevated right  heart pressure.  Frequent PVC's seen throughout the study.   Exercise sestamibi stress test 11/22/2018:  1. The patient performed treadmill exercise using Bruce protocol, completing 7:16 minutes. The patient completed an estimated workload of 8.9 METS, reaching 99% of the maximum predicted heart rate. Exercise capacity was low normal. Hemodynamic response was normal. Stress symptoms included fatigue.  No ischemic changes seen on stress electrocardiogram. Frequent PVC's seen both at rest and during exercise. 2. The overall quality of the study is good. There is no evidence of abnormal lung activity. Stress and rest SPECT images demonstrate homogeneous tracer distribution throughout the myocardium. Gated SPECT imaging reveals normal myocardial thickening and wall motion. The left ventricular ejection fraction was normal (50%).   3. Intermediate risk study due to frequent PVC's. No SPECT evidence of ischemia or infarction.   24-hour Holter monitor 08/15/2019:  Normal sinus rhythm.  Frequent PVCs (19.6%) including bigeminal and trigeminal patterns.  One episode of atrial tachycardia at 2013 for 5 beats.  No reported symptoms.  Minimum heart rate 50 bpm.  No A. fib or SVT was noted.  Recent labs: CMP Latest Ref Rng & Units 12/29/2019 09/19/2019 03/07/2016  Glucose 65 - 99 mg/dL 174(H) 175(H) 181(H)  BUN 6 - 24 mg/dL 17 11 16   Creatinine 0.76 - 1.27 mg/dL 1.12 0.92 0.89  Sodium 134 - 144 mmol/L 139 138 138  Potassium 3.5 - 5.2 mmol/L 4.3 4.0 3.8  Chloride 96 - 106 mmol/L 100 103 101  CO2 20 - 29 mmol/L 21 24 26   Calcium 8.7 - 10.2 mg/dL 10.1 9.8 9.4  Total Protein 6.0 - 8.3 g/dL -  7.4 -  Total Bilirubin 0.2 - 1.2 mg/dL - 0.6 -  Alkaline Phos 39 - 117 U/L - 79 -  AST 0 - 37 U/L - 25 -  ALT 0 - 53 U/L - 39 -   CBC Latest Ref Rng & Units 09/19/2019 03/20/2015  WBC 4.0 - 10.5 K/uL 7.8 7.4  Hemoglobin 13.0 - 17.0 g/dL 15.0 12.9(L)  Hematocrit 39.0 - 52.0 % 43.9 37.3(L)  Platelets  150.0 - 400.0 K/uL 229.0 201.0   Lipid Panel     Component Value Date/Time   CHOL 172 09/19/2019 1018   TRIG 131.0 09/19/2019 1018   HDL 41.10 09/19/2019 1018   CHOLHDL 4 09/19/2019 1018   VLDL 26.2 09/19/2019 1018   LDLCALC 105 (H) 09/19/2019 1018   LDLDIRECT 109.0 09/19/2019 1018   HEMOGLOBIN A1C Lab Results  Component Value Date   HGBA1C 7.8 (H) 03/07/2016   TSH No results for input(s): TSH in the last 8760 hours.  External labs: 02/19/2021: Sodium 139, potassium 4.1, BUN 14, creatinine 0.93, GFR >60 A1c 7.2% Total cholesterol 152, triglycerides 154, HDL 41, LDL 80   09/24/2020: Glucose 281, sodium 138, potassium 5.1, BUN 12, creatinine 0.84, GFR 100 Total cholesterol 206, triglycerides 285, HDL 40, LDL 116 A1c 9.1%  12/29/2019: Glucose 174, BUN/Cr 17/1.12. EGFR 75. Na/K 139/4.3.   09/2019: H/H 15/43. MCV 83. Platelets 229 Chol 172, TG 131, HDL 41, LDL 109 TSH 1.9 normal  2017: HbA1C 7.8%    Review of Systems  Cardiovascular:  Negative for chest pain, claudication, dyspnea on exertion, leg swelling, near-syncope, orthopnea, palpitations, paroxysmal nocturnal dyspnea and syncope.  Respiratory:  Negative for shortness of breath.         Vitals:   08/06/21 0833 08/06/21 0843  BP: (!) 153/92 (!) 148/95  Pulse: 66 (!) 58  Resp: 17   Temp: 98 F (36.7 C)   SpO2: 98% 98%     Body mass index is 26.56 kg/m. Filed Weights   08/06/21 0833  Weight: 190 lb 6.4 oz (86.4 kg)     Objective:   Physical Exam Vitals reviewed.  Constitutional:      General: He is not in acute distress. Neck:     Vascular: No JVD.  Cardiovascular:     Rate and Rhythm:  Normal rate and regular rhythm.     Pulses: Intact distal pulses.     Heart sounds: Normal heart sounds, S1 normal and S2 normal. No murmur heard.   No gallop.  Pulmonary:     Effort: Pulmonary effort is normal. No respiratory distress.     Breath sounds: Normal breath sounds. No wheezing, rhonchi or rales.  Abdominal:     Hernia: A hernia (ventral) is present.  Musculoskeletal:     Right lower leg: No edema.     Left lower leg: No edema.  Neurological:     Mental Status: He is alert.       Assessment & Recommendations:   55 y/o Caucasian male with hypertension, hyperlipidemia, type 2 DM, former smoker, frequent PVC's.  Hypertension: Blood pressure is elevated in the office today.  However review of home blood pressure readings shows it is well controlled otherwise. Continue amlodipine, metoprolol, losartan/hydrochlorothiazide, hydralazine. Advised patient he may take additional dose of hydralazine 50 mg if blood pressures >140/90 mmHg, he verbalized understanding agreement. Will avoid increase metoprolol as patient is symptomatic bradycardia with 50 mg daily.  Hyperlipidemia: Lipids well controlled Continue Lipitor 40 mg daily  PVC's: Continue metoprolol  Type 2 diabetes:  Management per endocrinology. Last A1c 7.2%, diabetes control improved  Follow-up in 6 months for hypertension and hyperlipidemia.   Alethia Berthold, PA-C 08/06/2021, 10:47 AM Office: [phone]

## 2021-10-27 ENCOUNTER — Other Ambulatory Visit: Payer: Self-pay | Admitting: Student

## 2021-12-02 ENCOUNTER — Other Ambulatory Visit: Payer: Self-pay | Admitting: Student

## 2021-12-20 ENCOUNTER — Other Ambulatory Visit: Payer: Self-pay

## 2021-12-23 ENCOUNTER — Encounter: Payer: Self-pay | Admitting: Family Medicine

## 2021-12-23 ENCOUNTER — Other Ambulatory Visit: Payer: Self-pay

## 2021-12-23 ENCOUNTER — Ambulatory Visit (INDEPENDENT_AMBULATORY_CARE_PROVIDER_SITE_OTHER): Payer: 59 | Admitting: Family Medicine

## 2021-12-23 VITALS — BP 150/92 | HR 86 | Temp 97.4°F | Ht 71.0 in | Wt 189.6 lb

## 2021-12-23 DIAGNOSIS — Z125 Encounter for screening for malignant neoplasm of prostate: Secondary | ICD-10-CM

## 2021-12-23 DIAGNOSIS — Z0000 Encounter for general adult medical examination without abnormal findings: Secondary | ICD-10-CM | POA: Diagnosis not present

## 2021-12-23 DIAGNOSIS — E291 Testicular hypofunction: Secondary | ICD-10-CM | POA: Insufficient documentation

## 2021-12-23 DIAGNOSIS — Z114 Encounter for screening for human immunodeficiency virus [HIV]: Secondary | ICD-10-CM

## 2021-12-23 DIAGNOSIS — E7800 Pure hypercholesterolemia, unspecified: Secondary | ICD-10-CM | POA: Insufficient documentation

## 2021-12-23 DIAGNOSIS — Z1159 Encounter for screening for other viral diseases: Secondary | ICD-10-CM | POA: Diagnosis not present

## 2021-12-23 DIAGNOSIS — K439 Ventral hernia without obstruction or gangrene: Secondary | ICD-10-CM | POA: Diagnosis not present

## 2021-12-23 DIAGNOSIS — E1165 Type 2 diabetes mellitus with hyperglycemia: Secondary | ICD-10-CM | POA: Insufficient documentation

## 2021-12-23 LAB — LIPID PANEL
Cholesterol: 152 mg/dL (ref 0–200)
HDL: 40 mg/dL (ref >39.00)
NonHDL: 111.65
Total CHOL/HDL Ratio: 4
Triglycerides: 240 mg/dL — ABNORMAL HIGH (ref 0.0–149.0)
VLDL: 48 mg/dL — ABNORMAL HIGH (ref 0.0–40.0)

## 2021-12-23 LAB — CBC WITH DIFFERENTIAL/PLATELET
Basophils Absolute: 0 10*3/uL (ref 0.0–0.1)
Basophils Relative: 0.6 % (ref 0.0–3.0)
Eosinophils Absolute: 0.3 10*3/uL (ref 0.0–0.7)
Eosinophils Relative: 4.5 % (ref 0.0–5.0)
HCT: 43.8 % (ref 39.0–52.0)
Hemoglobin: 14.8 g/dL (ref 13.0–17.0)
Lymphocytes Relative: 19.7 % (ref 12.0–46.0)
Lymphs Abs: 1.4 10*3/uL (ref 0.7–4.0)
MCHC: 33.9 g/dL (ref 30.0–36.0)
MCV: 83.6 fl (ref 78.0–100.0)
Monocytes Absolute: 0.5 10*3/uL (ref 0.1–1.0)
Monocytes Relative: 7.3 % (ref 3.0–12.0)
Neutro Abs: 4.7 10*3/uL (ref 1.4–7.7)
Neutrophils Relative %: 67.9 % (ref 43.0–77.0)
Platelets: 230 10*3/uL (ref 150.0–400.0)
RBC: 5.23 Mil/uL (ref 4.22–5.81)
RDW: 14 % (ref 11.5–15.5)
WBC: 7 10*3/uL (ref 4.0–10.5)

## 2021-12-23 LAB — COMPREHENSIVE METABOLIC PANEL
ALT: 29 U/L (ref 0–53)
AST: 18 U/L (ref 0–37)
Albumin: 5.1 g/dL (ref 3.5–5.2)
Alkaline Phosphatase: 67 U/L (ref 39–117)
BUN: 22 mg/dL (ref 6–23)
CO2: 24 mEq/L (ref 19–32)
Calcium: 10 mg/dL (ref 8.4–10.5)
Chloride: 99 mEq/L (ref 96–112)
Creatinine, Ser: 0.98 mg/dL (ref 0.40–1.50)
GFR: 87.14 mL/min (ref >60.00)
Glucose, Bld: 174 mg/dL — ABNORMAL HIGH (ref 70–99)
Potassium: 3.8 mEq/L (ref 3.5–5.1)
Sodium: 137 mEq/L (ref 135–145)
Total Bilirubin: 0.6 mg/dL (ref 0.2–1.2)
Total Protein: 7.6 g/dL (ref 6.0–8.3)

## 2021-12-23 LAB — PSA: PSA: 0.61 ng/mL (ref 0.10–4.00)

## 2021-12-23 LAB — URINALYSIS
Bilirubin Urine: NEGATIVE
Hgb urine dipstick: NEGATIVE
Ketones, ur: 15 — AB
Leukocytes,Ua: NEGATIVE
Nitrite: NEGATIVE
Specific Gravity, Urine: 1.02 (ref 1.000–1.030)
Total Protein, Urine: NEGATIVE
Urine Glucose: >=1000 — AB
Urobilinogen, UA: 0.2 (ref 0.0–1.0)
pH: 5.5 (ref 5.0–8.0)

## 2021-12-23 LAB — LDL CHOLESTEROL, DIRECT: Direct LDL: 82 mg/dL

## 2021-12-23 NOTE — Progress Notes (Signed)
? ?Established Patient Office Visit ? ?Subjective:  ?Patient ID: Raymond Sandoval, male    DOB: 22-Sep-1966  Age: 56 y.o. MRN: [id number] ? ?CC:  ?Chief Complaint  ?Patient presents with  ? Annual Exam  ?  CPE, possible hernia. Patient fasting.   ? ? ?HPI ?Raymond Sandoval presents for a physical exam.  He is fasting.  Doing well.  Continues follow-up with endocrinology for diabetes and with cardiology for labile blood pressure.  Pressures at home tend to be in the 130-140/80 range.  He is doing well.  Somewhat stressed with work.  Working 12-hour days.  Concerned about the possibility of hernia. ? ?Past Medical History:  ?Diagnosis Date  ? Appendicitis   ? Diabetes ([place]) 2006  ? Hyperlipidemia   ? Hypertension   ? ? ?Past Surgical History:  ?Procedure Laterality Date  ? APPENDECTOMY  1975  ? VASECTOMY    ? WISDOM TOOTH EXTRACTION    ? ? ?Family History  ?Problem Relation Age of Onset  ? Heart attack Mother   ? Heart failure Mother 7  ?     passed  ? Hypertension Mother   ? Diabetes Mother   ? Heart disease Mother   ? Arthritis Mother   ? Stroke Mother 81  ? Hypertension Father 31  ?     Deceased  ? Kidney failure Father   ? Diabetes Father   ? Heart disease Father   ? Healthy Brother   ?     x1  ? Diabetes Paternal Aunt   ?     x2  ? Diabetes Paternal Grandfather   ? Cancer Paternal Grandfather   ? Healthy Son   ?     x1  ? Heart disease Other   ?     Grandparents  ? Heart disease Other   ?     Paterna.l Aunts & Uncles  ? ? ?Social History  ? ?Socioeconomic History  ? Marital status: Married  ?  Spouse name: Not on file  ? Number of children: 1  ? Years of education: Not on file  ? Highest education level: Not on file  ?Occupational History  ? Occupation: Geophysicist/field seismologist  ?  Employer: [employer]  ?Tobacco Use  ? Smoking status: Former  ?  Packs/day: 0.10  ?  Years: 12.00  ?  Pack years: 1.20  ?  Types: Cigarettes  ?  Quit date: 79  ?  Years since quitting: 33.2  ? Smokeless tobacco: Former  ?  Types: Chew  ? Tobacco comments:  ?   Social Only  ?Vaping Use  ? Vaping Use: Never used  ?Substance and Sexual Activity  ? Alcohol use: Yes  ?  Alcohol/week: 0.0 standard drinks  ?  Comment: rare  ? Drug use: No  ? Sexual activity: Yes  ?  Partners: Female  ?Other Topics Concern  ? Not on file  ?Social History Narrative  ? 1 child -- boy.  ? ?Social Determinants of Health  ? ?Financial Resource Strain: Not on file  ?Food Insecurity: Not on file  ?Transportation Needs: Not on file  ?Physical Activity: Not on file  ?Stress: Not on file  ?Social Connections: Not on file  ?Intimate Partner Violence: Not on file  ? ? ?Outpatient Medications Prior to Visit  ?Medication Sig Dispense Refill  ? amLODipine (NORVASC) 10 MG tablet Take 1 tablet (10 mg total) by mouth daily. 90 tablet 3  ? atorvastatin (LIPITOR) 20 MG tablet Take 20  mg by mouth daily.    ? Cholecalciferol (VITAMIN D3) 25 MCG (1000 UT) CAPS Take 5,000 Units by mouth.    ? Continuous Blood Gluc Transmit (DEXCOM G6 TRANSMITTER) MISC one transmitter    ? hydrALAZINE (APRESOLINE) 50 MG tablet Take 1 tablet by mouth twice daily 60 tablet 0  ? JARDIANCE 25 MG TABS tablet Take 25 mg by mouth daily.    ? losartan-hydrochlorothiazide (HYZAAR) 100-25 MG tablet Take 1 tablet by mouth daily. 90 tablet 3  ? metoprolol succinate (TOPROL-XL) 25 MG 24 hr tablet Take 1 tablet (25 mg total) by mouth daily. 30 tablet 3  ? Multiple Vitamins-Minerals (B COMPLEX-C-E-ZINC) tablet Take 1 tablet by mouth daily.    ? SitaGLIPtin-MetFORMIN HCl (JANUMET XR) 50-1000 MG TB24 1 tablet with meals    ? tadalafil (CIALIS) 5 MG tablet TAKE 1 TABLET BY MOUTH ONCE DAILY AS NEEDED FOR ERECTILE DYSFUNCTION 12 tablet 0  ? TRADJENTA 5 MG TABS tablet Take 5 mg by mouth daily.    ? Multiple Vitamins-Minerals (MULTIVITAMIN WITH MINERALS) tablet Take 1 tablet by mouth daily. (Patient not taking: Reported on 12/23/2021)    ? ?No facility-administered medications prior to visit.  ? ? ?Allergies  ?Allergen Reactions  ? Codeine Nausea Only and  Rash  ? Penicillins Anaphylaxis  ? Penicillin V Potassium Other (See Comments)  ? Hydrocodone Nausea Only and Rash  ? ? ?ROS ?Review of Systems  ?Constitutional:  Negative for chills, diaphoresis, fatigue, fever and unexpected weight change.  ?HENT: Negative.    ?Eyes:  Negative for photophobia and visual disturbance.  ?Respiratory: Negative.    ?Cardiovascular: Negative.   ?Gastrointestinal: Negative.   ?Endocrine: Negative for polyphagia and polyuria.  ?Genitourinary:  Negative for difficulty urinating, frequency and urgency.  ?Musculoskeletal:  Negative for gait problem and joint swelling.  ?Neurological:  Negative for speech difficulty, weakness and light-headedness.  ? ?  ?[facility] screen [facility] 12/23/2021 12/23/2021 09/19/2019  ?Decreased Interest 0 0 0  ?Down, Depressed, Hopeless 0 0 0  ?PHQ - 2 Score 0 0 0  ?Altered sleeping 0 - -  ?Tired, decreased energy 0 - -  ?Change in appetite 0 - -  ?Feeling bad or failure about yourself  0 - -  ?Trouble concentrating 0 - -  ?Moving slowly or fidgety/restless 0 - -  ?Suicidal thoughts 0 - -  ?PHQ-9 Score 0 - -  ?Difficult doing work/chores Not difficult at all - -  ? ? ? ?Objective:  ?  ?Physical Exam ?Vitals and nursing note reviewed.  ?Constitutional:   ?   General: He is not in acute distress. ?   Appearance: Normal appearance. He is not ill-appearing, toxic-appearing or diaphoretic.  ?HENT:  ?   Head: Normocephalic and atraumatic.  ?   Right Ear: Tympanic membrane, ear canal and external ear normal.  ?   Left Ear: Tympanic membrane, ear canal and external ear normal.  ?   Mouth/Throat:  ?   Mouth: Mucous membranes are moist.  ?   Pharynx: Oropharynx is clear. No oropharyngeal exudate or posterior oropharyngeal erythema.  ?Eyes:  ?   General: No scleral icterus.    ?   Right eye: No discharge.     ?   Left eye: No discharge.  ?   Extraocular Movements: Extraocular movements intact.  ?   Conjunctiva/sclera: Conjunctivae normal.  ?   Pupils: Pupils are equal, round,  and reactive to light.  ?Cardiovascular:  ?   Rate and Rhythm:  Normal rate and regular rhythm.  ?   Pulses:     ?     Dorsalis pedis pulses are 2+ on the right side and 2+ on the left side.  ?     Posterior tibial pulses are 2+ on the right side and 2+ on the left side.  ?Pulmonary:  ?   Effort: Pulmonary effort is normal.  ?   Breath sounds: Normal breath sounds.  ?Abdominal:  ?   General: Bowel sounds are normal.  ?   Tenderness: There is no abdominal tenderness.  ?   Hernia: A hernia is present. Hernia is present in the ventral area. There is no hernia in the left inguinal area or right inguinal area.  ? ? ?Genitourinary: ?   Penis: No hypospadias, erythema, tenderness, discharge, swelling or lesions.   ?   Testes:     ?   Right: Mass, tenderness or swelling not present. Right testis is descended.     ?   Left: Mass, tenderness or swelling not present. Left testis is descended.  ?   Epididymis:  ?   Right: Not inflamed or enlarged.  ?   Left: Not inflamed or enlarged.  ?   Prostate: Enlarged. Not tender and no nodules present.  ?   Rectum: Guaiac result negative. No mass, tenderness, anal fissure, external hemorrhoid or internal hemorrhoid. Normal anal tone.  ?Musculoskeletal:  ?   Cervical back: No rigidity or tenderness.  ?   Right lower leg: No edema.  ?   Left lower leg: No edema.  ?Lymphadenopathy:  ?   Cervical: No cervical adenopathy.  ?   Lower Body: No right inguinal adenopathy. No left inguinal adenopathy.  ?Skin: ?   General: Skin is warm and dry.  ?Neurological:  ?   Mental Status: He is alert and oriented to person, place, and time.  ? ?Diabetic Foot Exam - Simple   ?Simple Foot Form ?Visual Inspection ?No deformities, no ulcerations, no other skin breakdown bilaterally: Yes ?Sensation Testing ?Intact to touch and monofilament testing bilaterally: Yes ?Pulse Check ?Posterior Tibialis and Dorsalis pulse intact bilaterally: Yes ?Comments ?Feet are near cavus bilaterally with elevated arches. ?  ?  ?BP  (!) 150/92 (BP Location: Left Arm, Patient Position: Sitting, Cuff Size: Large)   Pulse 86   Temp (!) 97.4 ?F (36.3 ?C) (Temporal)   Ht 5\' 11"  (1.803 m)   Wt 189 lb 9.6 oz (86 kg)   SpO2 98%   BMI 26.4

## 2021-12-24 LAB — HIV ANTIBODY (ROUTINE TESTING W REFLEX): HIV 1&2 Ab, 4th Generation: NONREACTIVE

## 2021-12-24 LAB — HEPATITIS C ANTIBODY
Hepatitis C Ab: NONREACTIVE
SIGNAL TO CUT-OFF: 0.04 (ref <1.00)

## 2021-12-31 ENCOUNTER — Other Ambulatory Visit: Payer: Self-pay | Admitting: Student

## 2022-02-02 ENCOUNTER — Other Ambulatory Visit: Payer: Self-pay | Admitting: Student

## 2022-02-03 ENCOUNTER — Ambulatory Visit: Payer: 59 | Admitting: Student

## 2022-03-06 ENCOUNTER — Other Ambulatory Visit: Payer: Self-pay | Admitting: Student

## 2022-03-07 NOTE — Progress Notes (Signed)
Follow up visit  Subjective:   Raymond Sandoval, male    DOB: 1966/10/01, 56 y.o.   MRN: [id number]   HPI  Chief Complaint  Patient presents with   Hyperlipidemia   Hypertension   Follow-up    61 month    56 y.o. Caucasian male with hypertension, hyperlipidemia, type 2 DM, former smoker, frequent PVC's.  Patient was last seen in our office 08/06/2021 at which point patient was stable from a cardiovascular standpoint and no changes were made.  Patient presents for 30-monthfollow-up.  Patient is feeling well without specific complaints today.  He continues to walk 4 miles 3-4 times per week without issue.  Does admit elevated home blood pressure readings.  Upon further questioning he reports high sodium intake in his diet.  Denies chest pain, dyspnea, palpitations.  Family History  Problem Relation Age of Onset   Heart attack Mother    Heart failure Mother 679      passed   Hypertension Mother    Diabetes Mother    Heart disease Mother    Arthritis Mother    Stroke Mother 561  Hypertension Father [phone]      Deceased   Kidney failure Father    Diabetes Father    Heart disease Father    Healthy Brother        x1   Diabetes Paternal Aunt        x2   Diabetes Paternal Grandfather    Cancer Paternal Grandfather    Healthy Son        x1   Heart disease Other        Grandparents   Heart disease Other        Paterna.l Aunts & Uncles   Past Surgical History:  Procedure Laterality Date   APPENDECTOMY  1975   VASECTOMY     WISDOM TOOTH EXTRACTION     Social History   Tobacco Use   Smoking status: Former    Packs/day: 0.10    Years: 12.00    Pack years: 1.20    Types: Cigarettes    Quit date: 1990    Years since quitting: 33.4   Smokeless tobacco: Former    Types: Chew   Tobacco comments:    Social Only  Substance Use Topics   Alcohol use: Yes    Alcohol/week: 0.0 standard drinks    Comment: rare   Past Medical History:  Diagnosis Date   Appendicitis     Diabetes ([place] 2006   Hyperlipidemia    Hypertension    Allergies  Allergen Reactions   Codeine Nausea Only and Rash   Penicillins Anaphylaxis   Penicillin V Potassium Other (See Comments)   Hydrocodone Nausea Only and Rash    Current Outpatient Medications on File Prior to Visit  Medication Sig Dispense Refill   amLODipine (NORVASC) 10 MG tablet Take 1 tablet (10 mg total) by mouth daily. 90 tablet 3   atorvastatin (LIPITOR) 20 MG tablet Take 20 mg by mouth daily.     Cholecalciferol (VITAMIN D3) 25 MCG (1000 UT) CAPS Take 5,000 Units by mouth.     Continuous Blood Gluc Transmit (DEXCOM G6 TRANSMITTER) MISC one transmitter     hydrALAZINE (APRESOLINE) 50 MG tablet Take 1 tablet by mouth twice daily 60 tablet 0   JARDIANCE 25 MG TABS tablet Take 25 mg by mouth daily.     losartan-hydrochlorothiazide (HYZAAR) 100-25 MG tablet Take 1 tablet by mouth daily.  90 tablet 3   metoprolol succinate (TOPROL-XL) 25 MG 24 hr tablet Take 1 tablet (25 mg total) by mouth daily. 30 tablet 3   SitaGLIPtin-MetFORMIN HCl (JANUMET XR) 50-1000 MG TB24 1 tablet with meals     tadalafil (CIALIS) 5 MG tablet TAKE 1 TABLET BY MOUTH ONCE DAILY AS NEEDED FOR ERECTILE DYSFUNCTION 12 tablet 0   TRADJENTA 5 MG TABS tablet Take 5 mg by mouth daily.     No current facility-administered medications on file prior to visit.    Cardiovascular & other pertient studies: EKG 03/10/2022:  Sinus rhythm at a rate of 64 bpm.  Normal axis.  Nonspecific T wave abnormality.  No evidence of ischemia or underlying injury pattern.  Compared to EKG 06/18/2021, no significant change.  EKG 12/11/2020: Sinus rhythm at a rate of 65 bpm.  Normal axis. Nonspecific T wave abnormality.  No evidence of ischemia or underlying injury pattern.  Compared to EKG 12/14/2019, no PVCs noted otherwise no significant change.  EKG 12/14/2019: Sinus rhythm, frequent PVC's  Echocardiogram 12/06/2018:  Left ventricle cavity is normal in size. Mild  concentric hypertrophy of  the left ventricle. Normal global wall motion. Normal diastolic filling  pattern. Calculated EF 55%.  Left atrial cavity is mildly dilated.  Mild (Grade I) mitral regurgitation.  Inadequate TR jet to estimate pulmonary artery systolic pressure.  IVC is dilated with respiratory variation. This may suggest elevated right  heart pressure.  Frequent PVC's seen throughout the study.   Exercise sestamibi stress test 11/22/2018:  1. The patient performed treadmill exercise using Bruce protocol, completing 7:16 minutes. The patient completed an estimated workload of 8.9 METS, reaching 99% of the maximum predicted heart rate. Exercise capacity was low normal. Hemodynamic response was normal. Stress symptoms included fatigue.  No ischemic changes seen on stress electrocardiogram. Frequent PVC's seen both at rest and during exercise. 2. The overall quality of the study is good. There is no evidence of abnormal lung activity. Stress and rest SPECT images demonstrate homogeneous tracer distribution throughout the myocardium. Gated SPECT imaging reveals normal myocardial thickening and wall motion. The left ventricular ejection fraction was normal (50%).   3. Intermediate risk study due to frequent PVC's. No SPECT evidence of ischemia or infarction.   24-hour Holter monitor 08/15/2019:  Normal sinus rhythm.  Frequent PVCs (19.6%) including bigeminal and trigeminal patterns.  One episode of atrial tachycardia at 2013 for 5 beats.  No reported symptoms.  Minimum heart rate 50 bpm.  No A. fib or SVT was noted.  Recent labs:    Latest Ref Rng & Units 12/23/2021    8:40 AM 12/29/2019   10:20 AM 09/19/2019   10:18 AM  CMP  Glucose 70 - 99 mg/dL 174   174   175    BUN 6 - 23 mg/dL 22   17   11     Creatinine 0.40 - 1.50 mg/dL 0.98   1.12   0.92    Sodium 135 - 145 mEq/L 137   139   138    Potassium 3.5 - 5.1 mEq/L 3.8   4.3   4.0    Chloride 96 - 112 mEq/L 99   100   103    CO2 19 -  32 mEq/L 24   21   24     Calcium 8.4 - 10.5 mg/dL 10.0   10.1   9.8    Total Protein 6.0 - 8.3 g/dL 7.6    7.4    Total  Bilirubin 0.2 - 1.2 mg/dL 0.6    0.6    Alkaline Phos 39 - 117 U/L 67    79    AST 0 - 37 U/L 18    25    ALT 0 - 53 U/L 29    39        Latest Ref Rng & Units 12/23/2021    8:40 AM 09/19/2019   10:18 AM 03/20/2015    9:02 AM  CBC  WBC 4.0 - 10.5 K/uL 7.0   7.8   7.4    Hemoglobin 13.0 - 17.0 g/dL 14.8   15.0   12.9    Hematocrit 39.0 - 52.0 % 43.8   43.9   37.3    Platelets 150.0 - 400.0 K/uL 230.0   229.0   201.0     Lipid Panel     Component Value Date/Time   CHOL 152 12/23/2021 0840   TRIG 240.0 (H) 12/23/2021 0840   HDL 40.00 12/23/2021 0840   CHOLHDL 4 12/23/2021 0840   VLDL 48.0 (H) 12/23/2021 0840   LDLCALC 105 (H) 09/19/2019 1018   LDLDIRECT 82.0 12/23/2021 0840   HEMOGLOBIN A1C Lab Results  Component Value Date   HGBA1C 7.8 (H) 03/07/2016   TSH No results for input(s): TSH in the last 8760 hours.  External labs: 09/23/2021: BUN 13, creatinine 0.84, GFR >60, potassium 4.2, sodium 139 A1c 8.5% Total cholesterol 156, HDL 39, LDL 82, triglycerides 173  02/19/2021: Sodium 139, potassium 4.1, BUN 14, creatinine 0.93, GFR >60 A1c 7.2% Total cholesterol 152, triglycerides 154, HDL 41, LDL 80   09/24/2020: Glucose 281, sodium 138, potassium 5.1, BUN 12, creatinine 0.84, GFR 100 Total cholesterol 206, triglycerides 285, HDL 40, LDL 116 A1c 9.1%  12/29/2019: Glucose 174, BUN/Cr 17/1.12. EGFR 75. Na/K 139/4.3.   09/2019: H/H 15/43. MCV 83. Platelets 229 Chol 172, TG 131, HDL 41, LDL 109 TSH 1.9 normal  2017: HbA1C 7.8%    Review of Systems  Cardiovascular:  Negative for chest pain, claudication, dyspnea on exertion, leg swelling, near-syncope, orthopnea, palpitations, paroxysmal nocturnal dyspnea and syncope.  Respiratory:  Negative for shortness of breath.         Vitals:   03/10/22 0828 03/10/22 0834  BP: (!) 148/99 (!) 149/94   Pulse: 65 64  Resp: 16   Temp: 98.1 F (36.7 C)   SpO2: 98% 98%     Body mass index is 26.64 kg/m. Filed Weights   03/10/22 0828  Weight: 191 lb (86.6 kg)     Objective:   Physical Exam Vitals reviewed.  Constitutional:      General: He is not in acute distress. Neck:     Vascular: No JVD.  Cardiovascular:     Rate and Rhythm: Normal rate and regular rhythm.     Pulses: Intact distal pulses.     Heart sounds: Normal heart sounds, S1 normal and S2 normal. No murmur heard.   No gallop.  Pulmonary:     Effort: Pulmonary effort is normal. No respiratory distress.     Breath sounds: Normal breath sounds. No wheezing, rhonchi or rales.  Musculoskeletal:     Right lower leg: No edema.     Left lower leg: No edema.  Neurological:     Mental Status: He is alert.       Assessment & Recommendations:   56 y.o. Caucasian male with hypertension, hyperlipidemia, type 2 DM, former smoker, frequent PVC's.  Hypertension: Blood pressure is elevated in the  office today as well as on home monitoring.  Patient admits to high sodium intake in his diet. Shared decision was for patient to work on reducing sodium intake significantly.  He will come back for repeat blood pressure check in 4 weeks and if blood pressure remains elevated will consider increasing hydralazine. Continue amlodipine, metoprolol, losartan/hydrochlorothiazide, hydralazine. Will avoid increase metoprolol as patient is symptomatic bradycardia with 50 mg daily.  Hyperlipidemia: I personally reviewed external records, lipids are well controlled Continue Lipitor 40 mg p.o. daily  PVC's: Symptoms are well controlled Continue metoprolol  Type 2 diabetes:  Management per endocrinology  Nurse visit for blood pressure check in 4 weeks.  Office visit in 6 months, sooner if needed.   Alethia Berthold, PA-C 03/10/2022, 8:59 AM Office: [phone]

## 2022-03-10 ENCOUNTER — Encounter: Payer: Self-pay | Admitting: Student

## 2022-03-10 ENCOUNTER — Ambulatory Visit: Payer: 59 | Admitting: Student

## 2022-03-10 VITALS — BP 149/94 | HR 64 | Temp 98.1°F | Resp 16 | Ht 71.0 in | Wt 191.0 lb

## 2022-03-10 DIAGNOSIS — E782 Mixed hyperlipidemia: Secondary | ICD-10-CM

## 2022-03-10 DIAGNOSIS — I493 Ventricular premature depolarization: Secondary | ICD-10-CM

## 2022-03-10 DIAGNOSIS — I10 Essential (primary) hypertension: Secondary | ICD-10-CM

## 2022-03-27 ENCOUNTER — Other Ambulatory Visit: Payer: Self-pay | Admitting: Student

## 2022-04-14 ENCOUNTER — Ambulatory Visit: Payer: 59

## 2022-04-20 ENCOUNTER — Other Ambulatory Visit: Payer: Self-pay | Admitting: Family Medicine

## 2022-04-20 DIAGNOSIS — N5201 Erectile dysfunction due to arterial insufficiency: Secondary | ICD-10-CM

## 2022-04-24 ENCOUNTER — Other Ambulatory Visit: Payer: Self-pay | Admitting: Student

## 2022-06-16 ENCOUNTER — Other Ambulatory Visit: Payer: Self-pay

## 2022-06-16 MED ORDER — HYDRALAZINE HCL 50 MG PO TABS: 50.0000 mg | ORAL_TABLET | Freq: Two times a day (BID) | ORAL | 0 refills | Status: DC

## 2022-06-16 NOTE — Telephone Encounter (Signed)
Spoke with patient , and patient was requesting a refill on the Hydralazine 50mg  . Refill Sent

## 2022-06-17 ENCOUNTER — Other Ambulatory Visit: Payer: Self-pay

## 2022-07-07 ENCOUNTER — Other Ambulatory Visit: Payer: Self-pay | Admitting: Internal Medicine

## 2022-08-05 ENCOUNTER — Other Ambulatory Visit: Payer: Self-pay | Admitting: Internal Medicine

## 2022-08-19 ENCOUNTER — Encounter: Payer: Self-pay | Admitting: Internal Medicine

## 2022-08-19 ENCOUNTER — Ambulatory Visit: Payer: 59 | Admitting: Student

## 2022-08-19 ENCOUNTER — Ambulatory Visit: Payer: 59 | Admitting: Internal Medicine

## 2022-08-19 VITALS — BP 139/96 | HR 71 | Ht 71.0 in | Wt 188.0 lb

## 2022-08-19 DIAGNOSIS — I10 Essential (primary) hypertension: Secondary | ICD-10-CM

## 2022-08-19 DIAGNOSIS — I493 Ventricular premature depolarization: Secondary | ICD-10-CM

## 2022-08-19 DIAGNOSIS — E782 Mixed hyperlipidemia: Secondary | ICD-10-CM

## 2022-08-19 DIAGNOSIS — E1165 Type 2 diabetes mellitus with hyperglycemia: Secondary | ICD-10-CM

## 2022-08-19 MED ORDER — HYDRALAZINE HCL 50 MG PO TABS: 50.0000 mg | ORAL_TABLET | Freq: Three times a day (TID) | ORAL | 3 refills | Status: DC

## 2022-08-19 NOTE — Progress Notes (Signed)
Follow up visit  Subjective:   Raymond Sandoval, male    DOB: 07-01-1966, 56 y.o.   MRN: [id number]   Hypertension Pertinent negatives include no chest pain, orthopnea, palpitations, PND or shortness of breath.    Chief Complaint  Patient presents with   Hypertension   Follow-up    56 y.o. Caucasian male with hypertension, hyperlipidemia, type 2 DM, former smoker, frequent PVC's.  Patient is here for follow-up visit. His BP is elevated here and he admits he does not watch his salt intake like he should. Patient is agreeable to increasing hydralazine to three times daily as he just needs a bit more blood pressure control. Otherwise, patient has not had any issues. He is taking his medications as prescribed. He denies chest pain, shortness of breath, palpitations, diaphoresis, syncope, edema, PND, orthopnea.   Family History  Problem Relation Age of Onset   Heart attack Mother    Heart failure Mother 3       passed   Hypertension Mother    Diabetes Mother    Heart disease Mother    Arthritis Mother    Stroke Mother 56   Hypertension Father 58       Deceased   Kidney failure Father    Diabetes Father    Heart disease Father    Healthy Brother        x1   Diabetes Paternal Aunt        x2   Diabetes Paternal Grandfather    Cancer Paternal Grandfather    Healthy Son        x1   Heart disease Other        Grandparents   Heart disease Other        Paterna.l Aunts & Uncles   Past Surgical History:  Procedure Laterality Date   APPENDECTOMY  1975   VASECTOMY     WISDOM TOOTH EXTRACTION     Social History   Tobacco Use   Smoking status: Former    Packs/day: 0.10    Years: 12.00    Total pack years: 1.20    Types: Cigarettes    Quit date: 1990    Years since quitting: 33.8   Smokeless tobacco: Former    Types: Chew   Tobacco comments:    Social Only  Substance Use Topics   Alcohol use: Yes    Alcohol/week: 0.0 standard drinks of alcohol    Comment: rare    Past Medical History:  Diagnosis Date   Appendicitis    Diabetes ([place]) 2006   Hyperlipidemia    Hypertension    Allergies  Allergen Reactions   Codeine Nausea Only and Rash   Penicillins Anaphylaxis   Penicillin V Potassium Other (See Comments)   Hydrocodone Nausea Only and Rash    Current Outpatient Medications on File Prior to Visit  Medication Sig Dispense Refill   amLODipine (NORVASC) 10 MG tablet Take 1 tablet (10 mg total) by mouth daily. 90 tablet 3   atorvastatin (LIPITOR) 20 MG tablet Take 20 mg by mouth daily.     Cholecalciferol (VITAMIN D3) 25 MCG (1000 UT) CAPS Take 5,000 Units by mouth.     Continuous Blood Gluc Transmit (DEXCOM G6 TRANSMITTER) MISC one transmitter     hydrALAZINE (APRESOLINE) 50 MG tablet Take 1 tablet by mouth twice daily 60 tablet 0   JARDIANCE 25 MG TABS tablet Take 25 mg by mouth daily.     losartan-hydrochlorothiazide (HYZAAR) 100-25 MG tablet Take  1 tablet by mouth daily.     metFORMIN (GLUCOPHAGE-XR) 500 MG 24 hr tablet Take 500 mg by mouth 2 (two) times daily with a meal.     metoprolol succinate (TOPROL-XL) 25 MG 24 hr tablet Take 1 tablet (25 mg total) by mouth daily. 30 tablet 3   MOUNJARO 2.5 MG/0.5ML Pen Inject 2.5 mg into the skin once a week.     tadalafil (CIALIS) 5 MG tablet TAKE 1 TABLET BY MOUTH ONCE DAILY AS NEEDED FOR ERECTILE DYSFUNCTION 12 tablet 3   Zinc 50 MG TABS 1 tablet Orally Once a day for 30 day(s)     No current facility-administered medications on file prior to visit.    Cardiovascular & other pertient studies: EKG 08/19/2022: Sinus rhythm.  Normal axis.  Nonspecific T wave abnormality.  No evidence of ischemia or underlying injury pattern.    EKG 03/10/2022:  Sinus rhythm at a rate of 64 bpm.  Normal axis.  Nonspecific T wave abnormality.  No evidence of ischemia or underlying injury pattern.  Compared to EKG 06/18/2021, no significant change.  EKG 12/11/2020: Sinus rhythm at a rate of 65 bpm.  Normal axis.  Nonspecific T wave abnormality.  No evidence of ischemia or underlying injury pattern.  Compared to EKG 12/14/2019, no PVCs noted otherwise no significant change.  EKG 12/14/2019: Sinus rhythm, frequent PVC's  Echocardiogram 12/06/2018:  Left ventricle cavity is normal in size. Mild concentric hypertrophy of  the left ventricle. Normal global wall motion. Normal diastolic filling  pattern. Calculated EF 55%.  Left atrial cavity is mildly dilated.  Mild (Grade I) mitral regurgitation.  Inadequate TR jet to estimate pulmonary artery systolic pressure.  IVC is dilated with respiratory variation. This may suggest elevated right  heart pressure.  Frequent PVC's seen throughout the study.   Exercise sestamibi stress test 11/22/2018:  1. The patient performed treadmill exercise using Bruce protocol, completing 7:16 minutes. The patient completed an estimated workload of 8.9 METS, reaching 99% of the maximum predicted heart rate. Exercise capacity was low normal. Hemodynamic response was normal. Stress symptoms included fatigue.  No ischemic changes seen on stress electrocardiogram. Frequent PVC's seen both at rest and during exercise. 2. The overall quality of the study is good. There is no evidence of abnormal lung activity. Stress and rest SPECT images demonstrate homogeneous tracer distribution throughout the myocardium. Gated SPECT imaging reveals normal myocardial thickening and wall motion. The left ventricular ejection fraction was normal (50%).   3. Intermediate risk study due to frequent PVC's. No SPECT evidence of ischemia or infarction.   24-hour Holter monitor 08/15/2019:  Normal sinus rhythm.  Frequent PVCs (19.6%) including bigeminal and trigeminal patterns.  One episode of atrial tachycardia at 2013 for 5 beats.  No reported symptoms.  Minimum heart rate 50 bpm.  No A. fib or SVT was noted.  Recent labs:    Latest Ref Rng & Units 12/23/2021    8:40 AM 12/29/2019   10:20 AM 09/19/2019    10:18 AM  CMP  Glucose 70 - 99 mg/dL 174  174  175   BUN 6 - 23 mg/dL _0 Creatinine 0.40 - 1.50 mg/dL 0.98  1.12  0.92   Sodium 135 - 145 mEq/L 137  139  138   Potassium 3.5 - 5.1 mEq/L 3.8  4.3  4.0   Chloride 96 - 112 mEq/L 99  100  103   CO2 19 - 32 mEq/L 24  21  24   Calcium 8.4 - 10.5 mg/dL 10.0  10.1  9.8   Total Protein 6.0 - 8.3 g/dL 7.6   7.4   Total Bilirubin 0.2 - 1.2 mg/dL 0.6   0.6   Alkaline Phos 39 - 117 U/L 67   79   AST 0 - 37 U/L 18   25   ALT 0 - 53 U/L 29   39       Latest Ref Rng & Units 12/23/2021    8:40 AM 09/19/2019   10:18 AM 03/20/2015    9:02 AM  CBC  WBC 4.0 - 10.5 K/uL 7.0  7.8  7.4   Hemoglobin 13.0 - 17.0 g/dL 14.8  15.0  12.9   Hematocrit 39.0 - 52.0 % 43.8  43.9  37.3   Platelets 150.0 - 400.0 K/uL 230.0  229.0  201.0    Lipid Panel     Component Value Date/Time   CHOL 152 12/23/2021 0840   TRIG 240.0 (H) 12/23/2021 0840   HDL 40.00 12/23/2021 0840   CHOLHDL 4 12/23/2021 0840   VLDL 48.0 (H) 12/23/2021 0840   LDLCALC 105 (H) 09/19/2019 1018   LDLDIRECT 82.0 12/23/2021 0840   HEMOGLOBIN A1C Lab Results  Component Value Date   HGBA1C 7.8 (H) 03/07/2016   TSH No results for input(s): "TSH" in the last 8760 hours.  External labs: 09/23/2021: BUN 13, creatinine 0.84, GFR >60, potassium 4.2, sodium 139 A1c 8.5% Total cholesterol 156, HDL 39, LDL 82, triglycerides 173  02/19/2021: Sodium 139, potassium 4.1, BUN 14, creatinine 0.93, GFR >60 A1c 7.2% Total cholesterol 152, triglycerides 154, HDL 41, LDL 80   09/24/2020: Glucose 281, sodium 138, potassium 5.1, BUN 12, creatinine 0.84, GFR 100 Total cholesterol 206, triglycerides 285, HDL 40, LDL 116 A1c 9.1%  12/29/2019: Glucose 174, BUN/Cr 17/1.12. EGFR 75. Na/K 139/4.3.   09/2019: H/H 15/43. MCV 83. Platelets 229 Chol 172, TG 131, HDL 41, LDL 109 TSH 1.9 normal  2017: HbA1C 7.8%    Review of Systems  Cardiovascular:  Negative for chest pain, claudication,  dyspnea on exertion, leg swelling, near-syncope, orthopnea, palpitations, paroxysmal nocturnal dyspnea and syncope.  Respiratory:  Negative for shortness of breath.          Vitals:   08/19/22 0828 08/19/22 0837  BP: (!) 151/94 (!) 139/96  Pulse: 82 71  SpO2: 100% 100%     Body mass index is 26.22 kg/m. Filed Weights   08/19/22 0828  Weight: 188 lb (85.3 kg)     Objective:   Physical Exam Vitals reviewed.  Constitutional:      General: He is not in acute distress. Neck:     Vascular: No JVD.  Cardiovascular:     Rate and Rhythm: Normal rate and regular rhythm.     Pulses: Intact distal pulses.     Heart sounds: Normal heart sounds, S1 normal and S2 normal. No murmur heard.    No gallop.  Pulmonary:     Effort: Pulmonary effort is normal. No respiratory distress.     Breath sounds: Normal breath sounds. No wheezing, rhonchi or rales.  Musculoskeletal:     Right lower leg: No edema.     Left lower leg: No edema.  Neurological:     Mental Status: He is alert.        Assessment & Recommendations:   56 y.o. Caucasian male with hypertension, hyperlipidemia, type 2 DM, former smoker, frequent PVC's.  Hypertension: Blood pressure is elevated in the  office today as well as on home monitoring.  Patient admits to high sodium intake in his diet. Will increase hydralazine to TID Continue amlodipine, metoprolol, losartan/hydrochlorothiazide Will avoid increase metoprolol as patient is symptomatic bradycardia with 50 mg daily.  Hyperlipidemia: Continue Lipitor 40 mg p.o. daily  PVC's: Symptoms are well controlled Continue metoprolol  Type 2 diabetes:  Management per endocrinology  Follow-up in 12 months or sooner if needed    Floydene Flock, DO, [facility] 08/19/2022, 8:48 AM Office: [phone]

## 2022-12-25 ENCOUNTER — Encounter: Payer: 59 | Admitting: Family Medicine

## 2022-12-26 ENCOUNTER — Other Ambulatory Visit: Payer: Self-pay

## 2022-12-26 MED ORDER — HYDRALAZINE HCL 50 MG PO TABS: 50.0000 mg | ORAL_TABLET | Freq: Three times a day (TID) | ORAL | 2 refills | Status: DC

## 2023-01-08 ENCOUNTER — Other Ambulatory Visit: Payer: Self-pay | Admitting: Internal Medicine

## 2023-05-22 LAB — HM DIABETES EYE EXAM

## 2023-06-30 ENCOUNTER — Other Ambulatory Visit: Payer: Self-pay | Admitting: Family Medicine

## 2023-08-19 NOTE — Progress Notes (Unsigned)
  Cardiology Office Note:  .   Date:  08/19/2023  ID:  Raymond Sandoval, DOB [birth year], MRN [id number] PCP: Mliss Sax, MD  [facility] Providers Cardiologist:  Truett Mainland, MD PCP: Mliss Sax, MD  No chief complaint on file.     History of Present Illness: Marland Kitchen    Raymond Sandoval is a 57 y.o. male with hypertension, hyperlipidemia, type 2 DM, former smoker, frequent PVCs  There were no vitals filed for this visit.   ROS: *** ROS   Studies Reviewed: Marland Kitchen       Stress test, echocardiogram, Holter monitor 2020 Lipid panel 12/2021: Direct LDL b82, TG 240  *** Risk Assessment/Calculations:   {Does this patient have ATRIAL FIBRILLATION?:[phone]}   Physical Exam:   Physical Exam   VISIT DIAGNOSES: No diagnosis found.   ASSESSMENT AND PLAN: .    Raymond Sandoval is a 57 y.o. male with hypertension, hyperlipidemia, type 2 DM, former smoker, frequent PVCs  {Are you ordering a CV Procedure (e.g. stress test, cath, DCCV, TEE, etc)?   Press F2        :[id number]}    No orders of the defined types were placed in this encounter.    F/u in ***  Signed, Elder Negus, MD

## 2023-08-20 ENCOUNTER — Ambulatory Visit: Payer: 59 | Admitting: Internal Medicine

## 2023-08-20 ENCOUNTER — Encounter: Payer: Self-pay | Admitting: Cardiology

## 2023-08-20 ENCOUNTER — Other Ambulatory Visit: Payer: Self-pay

## 2023-08-20 ENCOUNTER — Ambulatory Visit: Payer: 59 | Attending: Internal Medicine | Admitting: Cardiology

## 2023-08-20 VITALS — BP 138/98 | HR 65 | Resp 16 | Ht 71.0 in | Wt 191.8 lb

## 2023-08-20 DIAGNOSIS — I10 Essential (primary) hypertension: Secondary | ICD-10-CM

## 2023-08-20 DIAGNOSIS — E782 Mixed hyperlipidemia: Secondary | ICD-10-CM | POA: Diagnosis not present

## 2023-08-20 DIAGNOSIS — I493 Ventricular premature depolarization: Secondary | ICD-10-CM | POA: Diagnosis not present

## 2023-08-20 NOTE — Addendum Note (Signed)
Addended by: Elder Negus on: 08/20/2023 09:02 AM   Modules accepted: Orders

## 2023-08-20 NOTE — Patient Instructions (Addendum)
Medication Instructions:  Your physician recommends that you continue on your current medications as directed. Please refer to the Current Medication list given to you today.  *If you need a refill on your cardiac medications before your next appointment, please call your pharmacy*  Lab Work: None ordered today. If you have labs (blood work) drawn today and your tests are completely normal, you will receive your results only by: MyChart Message (if you have MyChart) OR A paper copy in the mail If you have any lab test that is abnormal or we need to change your treatment, we will call you to review the results.  Testing/Procedures: Your physician has requested that you have an exercise tolerance test. For further information please visit [email or web address] Please also follow instruction sheet, as given.  Follow-Up: At [facility], you and your health needs are our priority.  As part of our continuing mission to provide you with exceptional heart care, we have created designated Provider Care Teams.  These Care Teams include your primary Cardiologist (physician) and Advanced Practice Providers (APPs -  Physician Assistants and Nurse Practitioners) who all work together to provide you with the care you need, when you need it.  Your physician has requested you to have a referral to PharmD in 1-2 weeks for blood pressure management.   Your next appointment:   1 year(s)  The format for your next appointment:   In Person  Provider:   Elder Negus, MD or APP

## 2023-09-01 ENCOUNTER — Ambulatory Visit: Payer: 59 | Attending: Cardiology

## 2023-09-01 DIAGNOSIS — I10 Essential (primary) hypertension: Secondary | ICD-10-CM

## 2023-09-01 LAB — EXERCISE TOLERANCE TEST
Angina Index: 0
Duke Treadmill Score: -5
Estimated workload: 11.8
Exercise duration (min): 10 min
Exercise duration (sec): 3 s
MPHR: 164 {beats}/min
Peak HR: 162 {beats}/min
Percent HR: 98 %
RPE: 15
Rest HR: 75 {beats}/min
ST Depression (mm): 3 mm

## 2023-09-02 ENCOUNTER — Encounter: Payer: Self-pay | Admitting: *Deleted

## 2023-09-02 ENCOUNTER — Telehealth: Payer: Self-pay | Admitting: *Deleted

## 2023-09-02 DIAGNOSIS — R9439 Abnormal result of other cardiovascular function study: Secondary | ICD-10-CM

## 2023-09-02 DIAGNOSIS — R9431 Abnormal electrocardiogram [ECG] [EKG]: Secondary | ICD-10-CM

## 2023-09-02 DIAGNOSIS — E1165 Type 2 diabetes mellitus with hyperglycemia: Secondary | ICD-10-CM

## 2023-09-02 DIAGNOSIS — R072 Precordial pain: Secondary | ICD-10-CM

## 2023-09-02 NOTE — Progress Notes (Signed)
Significant EKG changes noted on treadmill stress test.  Fortunately, patient does not have any symptoms associated with this.  That said, given the significant EKG abnormalities, and his work as a Hospital doctor, I recommend coronary CT angiogram for definitive coronary anatomy evaluation.  He is currently on metoprolol succinate 25 mg daily.  He can be given additional dose of the same medication prior to the CT angiogram for better heart rate control.  He will needs BMP labs before CT angiogram.  Thanks MJP

## 2023-09-02 NOTE — Telephone Encounter (Signed)
The patient has been notified of the result and verbalized understanding.  All questions (if any) were answered.  Pt aware we will order for him to get a Cardiac CT done to be assess his heart arteries.   Pt is aware that I will place the order for the Cardiac CT in the system and send a message to our pre-cert dept and CT scheduler to call him back to have this arranged.   Briefly went over Cardiac CT instructions with him on the phone, and he is aware that I will send detailed instructions for this test, to his mychart account.   Advised him that he will need to take Toprol XL 50 mg 2 hrs prior to scan and will need to come in for a BMET, once CTA is scheduled.   Also educated him about meds to hold for this test, and hold metformin 48 hrs after CT.   Pt verbalized understanding and agrees with this plan.       Your cardiac CT will be scheduled at one of the below locations:   [facility] [address] [place], [place] [address] [phone]   If scheduled at [facility], please arrive at the [facility] (Entrance C2) of [facility] 30 minutes prior to test start time. You can use the FREE valet parking offered at entrance C (encouraged to control the heart rate for the test)  Proceed to the [facility] Radiology Department (first floor) to check-in and test prep.  All radiology patients and guests should use entrance C2 at [facility], accessed from [facility], even though the hospital's physical address listed is [address].     Please follow these instructions carefully (unless otherwise directed):  An IV will be required for this test and Nitroglycerin will be given.  Hold all erectile dysfunction medications at least 3 days (72 hrs) prior to test. (Ie viagra, cialis, sildenafil, tadalafil, etc) --HOLD CIALIS 3 DAYS PRIOR TO TEST  On the Night Before the Test: Be sure to Drink plenty of  water. Do not consume any caffeinated/decaffeinated beverages or chocolate 12 hours prior to your test. Do not take any antihistamines 12 hours prior to your test.   On the Day of the Test: Drink plenty of water until 1 hour prior to the test. Do not eat any food 1 hour prior to test. You may take your regular medications prior to the test.  Take metoprolol succinate (Toprol XL) 50 mg two hours prior to test. If you take LOSARTAN-hydrochlorothiazide (HYZAAR) please HOLD on the morning of the test.        After the Test: Drink plenty of water. After receiving IV contrast, you may experience a mild flushed feeling. This is normal. On occasion, you may experience a mild rash up to 24 hours after the test. This is not dangerous. If this occurs, you can take Benadryl 25 mg and increase your fluid intake. If you experience trouble breathing, this can be serious. If it is severe call 911 IMMEDIATELY. If it is mild, please call our office. If you take any of these medications: Glipizide/Metformin, Avandament, Glucavance, please do not take 48 hours after completing test unless otherwise instructed.  We will call to schedule your test 2-4 weeks out understanding that some insurance companies will need an authorization prior to the service being performed.   For more information and frequently asked questions, please visit our website : [email or web address]  For non-scheduling related questions, please contact the cardiac imaging nurse navigator should you have any questions/concerns: Cardiac Imaging Nurse Navigators Direct Office Dial: [phone]   For scheduling needs, including cancellations and rescheduling, please call [place], [phone].

## 2023-09-02 NOTE — Telephone Encounter (Signed)
-----   Message from [facility] sent at 09/02/2023  4:42 PM EST ----- Significant EKG changes noted on treadmill stress test.  Fortunately, patient does not have any symptoms associated with this.  That said, given the significant EKG abnormalities, and his work as a Hospital doctor, I recommend coronary CT angiogram for definitive coronary anatomy evaluation.  He is currently on metoprolol succinate 25 mg daily.  He can be given additional dose of the same medication prior to the CT angiogram for better heart rate control.  He will needs BMP labs before CT angiogram.  Thanks MJP

## 2023-09-17 ENCOUNTER — Ambulatory Visit: Payer: 59

## 2023-09-17 ENCOUNTER — Encounter (HOSPITAL_COMMUNITY): Payer: Self-pay

## 2023-09-18 ENCOUNTER — Telehealth: Payer: Self-pay | Admitting: *Deleted

## 2023-09-18 DIAGNOSIS — Z0189 Encounter for other specified special examinations: Secondary | ICD-10-CM

## 2023-09-18 DIAGNOSIS — R072 Precordial pain: Secondary | ICD-10-CM

## 2023-09-18 DIAGNOSIS — E1165 Type 2 diabetes mellitus with hyperglycemia: Secondary | ICD-10-CM

## 2023-09-18 NOTE — Telephone Encounter (Signed)
Called the pt and informed him that he will need a BMET done today for upcoming Cardiac CT on next Monday, per CT protocol.  Pt states he will drop by [facility] in our building today, to have this done.   BMET order placed and released in the system for [facility] to see.   Will make CT RN Navigator aware of this.

## 2023-09-19 LAB — BASIC METABOLIC PANEL
BUN/Creatinine Ratio: 19 (ref 9–20)
BUN: 18 mg/dL (ref 6–24)
CO2: 24 mmol/L (ref 20–29)
Calcium: 9.3 mg/dL (ref 8.7–10.2)
Chloride: 103 mmol/L (ref 96–106)
Creatinine, Ser: 0.97 mg/dL (ref 0.76–1.27)
Glucose: 167 mg/dL — ABNORMAL HIGH (ref 70–99)
Potassium: 3.7 mmol/L (ref 3.5–5.2)
Sodium: 139 mmol/L (ref 134–144)
eGFR: 92 mL/min/{1.73_m2} (ref >59)

## 2023-09-21 ENCOUNTER — Ambulatory Visit (HOSPITAL_COMMUNITY)
Admission: RE | Admit: 2023-09-21 | Discharge: 2023-09-21 | Disposition: A | Discharge status: Home / Self Care | Payer: 59 | Source: Ambulatory Visit | Attending: Cardiology | Admitting: Cardiology

## 2023-09-21 ENCOUNTER — Other Ambulatory Visit: Payer: Self-pay | Admitting: Cardiology

## 2023-09-21 ENCOUNTER — Ambulatory Visit (HOSPITAL_BASED_OUTPATIENT_CLINIC_OR_DEPARTMENT_OTHER)
Admission: RE | Admit: 2023-09-21 | Discharge: 2023-09-21 | Disposition: A | Discharge status: Home / Self Care | Payer: 59 | Source: Ambulatory Visit | Attending: Cardiology | Admitting: Cardiology

## 2023-09-21 DIAGNOSIS — I2510 Atherosclerotic heart disease of native coronary artery without angina pectoris: Secondary | ICD-10-CM

## 2023-09-21 DIAGNOSIS — R072 Precordial pain: Secondary | ICD-10-CM | POA: Diagnosis present

## 2023-09-21 DIAGNOSIS — E1165 Type 2 diabetes mellitus with hyperglycemia: Secondary | ICD-10-CM | POA: Insufficient documentation

## 2023-09-21 DIAGNOSIS — R931 Abnormal findings on diagnostic imaging of heart and coronary circulation: Secondary | ICD-10-CM

## 2023-09-21 DIAGNOSIS — R9431 Abnormal electrocardiogram [ECG] [EKG]: Secondary | ICD-10-CM | POA: Diagnosis present

## 2023-09-21 MED ORDER — IOHEXOL 350 MG/ML SOLN
95.0000 mL | Freq: Once | INTRAVENOUS | Status: AC | PRN
  Administered 2023-09-21: 95 mL via INTRAVENOUS

## 2023-09-21 MED ORDER — NITROGLYCERIN 0.4 MG SL SUBL
0.8000 mg | SUBLINGUAL_TABLET | Freq: Once | SUBLINGUAL | Status: AC
  Administered 2023-09-21: 0.8 mg via SUBLINGUAL

## 2023-09-21 MED ORDER — NITROGLYCERIN 0.4 MG SL SUBL
SUBLINGUAL_TABLET | SUBLINGUAL | Status: AC
  Filled 2023-09-21: qty 2

## 2023-09-21 NOTE — Progress Notes (Signed)
CT FFR suggests significant stenosis in the proximal circumflex and distal RCA. 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 Not emergent

## 2023-09-21 NOTE — Progress Notes (Signed)
CT FFR positive for Cx and RCA disease. See impression on that

## 2023-09-25 ENCOUNTER — Ambulatory Visit: Payer: 59 | Attending: Cardiology | Admitting: Cardiology

## 2023-09-25 ENCOUNTER — Encounter: Payer: Self-pay | Admitting: Cardiology

## 2023-09-25 ENCOUNTER — Other Ambulatory Visit: Payer: Self-pay | Admitting: *Deleted

## 2023-09-25 VITALS — BP 130/84 | HR 63 | Ht 71.0 in | Wt 197.4 lb

## 2023-09-25 DIAGNOSIS — I493 Ventricular premature depolarization: Secondary | ICD-10-CM | POA: Diagnosis not present

## 2023-09-25 DIAGNOSIS — R931 Abnormal findings on diagnostic imaging of heart and coronary circulation: Secondary | ICD-10-CM | POA: Diagnosis not present

## 2023-09-25 DIAGNOSIS — R072 Precordial pain: Secondary | ICD-10-CM

## 2023-09-25 DIAGNOSIS — I10 Essential (primary) hypertension: Secondary | ICD-10-CM

## 2023-09-25 DIAGNOSIS — Z01812 Encounter for preprocedural laboratory examination: Secondary | ICD-10-CM

## 2023-09-25 MED ORDER — ASPIRIN 81 MG PO TBEC
81.0000 mg | DELAYED_RELEASE_TABLET | Freq: Every day | ORAL | Status: AC
Start: 2023-09-25 — End: ?

## 2023-09-25 MED ORDER — ATORVASTATIN CALCIUM 40 MG PO TABS: 40.0000 mg | ORAL_TABLET | Freq: Every day | ORAL | 3 refills | Status: DC

## 2023-09-25 NOTE — Progress Notes (Signed)
Cardiology Office Note:  .   Date:  09/25/2023  ID:  Raymond Sandoval, DOB November 27, 1965, MRN [id number] PCP: Mliss Sax, MD  [facility] Providers Cardiologist:  Elder Negus, MD     History of Present Illness: Raymond Sandoval   Raymond Sandoval is a 57 y.o. 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 Recent cardiac CT scan with FFR analysis suggested stenosis in the left circumflex and right coronary artery, with values of 0.59 and less than 0.5 respectively. The patient's calcium score was 1747, placing him in the 99th percentile.  The patient reports feeling tired after work and experiences a sense of tightness, which he attributes to his physical labor. He denies any acute chest pain or discomfort. However, he expresses a heightened sense of awareness and concern about his health since learning about his cardiac condition.  The patient's medication regimen includes Jardiance 25mg , amlodipine 10mg , hydralazine 50mg  TID, Hyzaar 100/25mg , metoprolol 25mg , atorvastatin 20mg , and fenofibrate 54mg . The patient also takes Mounjaro once a week.  The patient has a family history of cardiac disease, with both parents having heart problems. The patient's mother had a heart attack at an early age, and his father underwent a triple bypass at the age of 42, in addition to having diabetes. The patient quit smoking several years ago.           Studies Reviewed: Raymond Sandoval   EKG Interpretation Date/Time:  Friday September 25 2023 13:08:08 EST Ventricular Rate:  63 PR Interval:  198 QRS Duration:  96 QT Interval:  430 QTC Calculation: 440 R Axis:   50  Text Interpretation: Normal sinus rhythm  with sinus arrhythmia Inferior infarct , age undetermined When compared with ECG of 20-Aug-2023 08:04, Inferior infarct is now Present Confirmed by Donato Schultz ([id number]) on 09/25/2023 1:09:55 PM    Results   LABS LDL: 86 HbA1c: 7.8 Creatinine: 0.97 (09/18/2023) Hemoglobin: 14.8 (12/23/2021)  RADIOLOGY Cardiac CT: FFR analysis: left circumflex stenosis (0.59), right coronary artery stenosis (<0.5); calcium score: 1747, 99th percentile  DIAGNOSTIC EKG: Inferior infarct pattern (09/25/2023)     Risk Assessment/Calculations:            Physical Exam:   VS:  BP 130/84   Pulse 63   Ht 5\' 11"  (1.803 m)   Wt 197 lb 6.4 oz (89.5 kg)   SpO2 99%   BMI 27.53 kg/m    Wt Readings from Last 3 Encounters:  09/25/23 197 lb 6.4 oz (89.5 kg)  08/20/23 191 lb 12.8 oz (87 kg)  08/19/22 188 lb (85.3 kg)    GEN: Well nourished, well developed in no acute distress NECK: No JVD; No carotid bruits CARDIAC: RRR, no murmurs, no rubs, no gallops RESPIRATORY:  Clear to auscultation without rales, wheezing or rhonchi  ABDOMEN: Soft, non-tender, non-distended EXTREMITIES:  No edema; No deformity   ASSESSMENT AND PLAN: .    Assessment and Plan    Coronary Artery Disease (CAD) Diabetes, hypertension, hyperlipidemia, and frequent PVCs. Cardiac CT showed left circumflex stenosis (FFR 0.59) and right coronary artery stenosis (FFR <0.5). Calcium score 1747 (99th percentile). EKG shows inferior infarct pattern. Reports occasional tightness and fatigue. Discussed cardiac catheterization for confirmation and potential angioplasty/stenting.  Risks: stroke, heart attack, death (<1 in 1000). Benefits: accurate diagnosis, potential immediate treatment. Alternatives: medical management with atorvastatin, fenofibrate, aspirin. - Schedule cardiac catheterization for January 8th, 9th, or 10th, 2025 - Hold [place] one week prior to the procedure - Increase atorvastatin to 40 mg daily - Start aspirin 81 mg daily -  Continue Jardiance 25 mg daily, amlodipine 10 mg daily, hydralazine 50 mg TID, losartan/HCTZ 100/25 mg daily, metoprolol 25 mg daily, fenofibrate 54 mg daily - Advise to call 911 or go to the ER if experiencing severe chest pain or discomfort  Diabetes Mellitus Type 2 Currently on Jardiance 25 mg daily and Mounjaro weekly. Hemoglobin A1c is 7.8%. Emphasized importance of diabetes control to prevent further cardiovascular complications. - Continue Jardiance 25 mg daily - OK to take Greggory Keen (takes Tues and cath is on Fri)   Hypertension Currently on multiple antihypertensive medications. - Continue amlodipine 10 mg daily, hydralazine 50 mg TID, losartan/HCTZ 100/25 mg daily, metoprolol 25 mg daily  Hyperlipidemia Currently on atorvastatin 20 mg daily and fenofibrate 54 mg daily. LDL is 86. - Increase atorvastatin to 40 mg daily - Continue fenofibrate 54 mg daily  Follow-up - Schedule cardiac catheterization for January 8th, 9th, or 10th, 2025           Informed Consent   Shared Decision Making/Informed Consent The risks [stroke (1 in 1000), death (1 in 1000), kidney failure [usually temporary] (1 in 500), bleeding (1 in 200), allergic reaction [possibly serious] (1 in 200)], benefits (diagnostic support and management of coronary artery disease) and alternatives of a cardiac catheterization were discussed in detail with Mr. Petrick and he is willing to proceed.     Dispo: post cath with Dr. Rosemary Holms  Signed, Donato Schultz, MD

## 2023-09-25 NOTE — Patient Instructions (Signed)
Medication Instructions:  Please start Aspirin 81 mg a day. Increase Atorvastatin to 40 mg a day. Continue all other medications as listed.  *If you need a refill on your cardiac medications before your next appointment, please call your pharmacy*   Lab Work: Please have blood work Monday October 12, 2023 at your closest [facility] (BMP, CBC)  If you have labs (blood work) drawn today and your tests are completely normal, you will receive your results only by: MyChart Message (if you have MyChart) OR A paper copy in the mail If you have any lab test that is abnormal or we need to change your treatment, we will call you to review the results.   Testing/Procedures:   [place] A DEPT OF [facility] AT [facility] [address] [place] [place] [address] Dept: [phone] Loc: [phone]  Raymond Sandoval  09/25/2023  You are scheduled for a Cardiac Catheterization on Friday, October 16, 2023 with Dr. Peter [place].  1. Please arrive at the [facility] (Main Entrance A) at [facility]: [address] [place], [place] [address] at 5:30 AM (two hours before your procedure to ensure your preparation). Free valet parking service is available.   Special note: Every effort is made to have your procedure done on time. Please understand that emergencies sometimes delay scheduled procedures.  2. Diet: Do not eat or drink anything after midnight prior to your procedure except sips of water to take medications.  3. Labs:  10/12/2023  4. Medication instructions in preparation for your procedure:  Hold Glimepiride, Metformin and Jardiance this morning.  Make sure to take your Aspirin 81 mg and any morning medicines NOT listed above.  You may use sips of water.  5. Plan for one night stay--bring personal belongings. 6. Bring a current list of your medications and current insurance cards. 7. You MUST have a  responsible person to drive you home. 8. Someone MUST be with you the first 24 hours after you arrive home or your discharge will be delayed. 9. Please wear clothes that are easy to get on and off and wear slip-on shoes.  Thank you for allowing [place] to care for you!   -- [facility]   Follow-Up: At [facility], you and your health needs are our priority.  As part of our continuing mission to provide you with exceptional heart care, we have created designated Provider Care Teams.  These Care Teams include your primary Cardiologist (physician) and Advanced Practice Providers (APPs -  Physician Assistants and Nurse Practitioners) who all work together to provide you with the care you need, when you need it.  We recommend signing up for the patient portal called "MyChart".  Sign up information is provided on this After Visit Summary.  MyChart is used to connect with patients for Virtual Visits (Telemedicine).  Patients are able to view lab/test results, encounter notes, upcoming appointments, etc.  Non-urgent messages can be sent to your provider as well.   To learn more about what you can do with MyChart, go to [email or web address].    Your next appointment:   Follow up will be based on the results of the above testing.

## 2023-10-05 NOTE — Progress Notes (Signed)
No worries. Keep as is.  Thanks MJP

## 2023-10-05 NOTE — Progress Notes (Signed)
Since I saw him in office, would you mind if I do the cath?  Thanks [employer]

## 2023-10-12 LAB — CBC

## 2023-10-13 LAB — CBC
Hematocrit: 45 % (ref 37.5–51.0)
Hemoglobin: 14.2 g/dL (ref 13.0–17.7)
MCH: 27.3 pg (ref 26.6–33.0)
MCHC: 31.6 g/dL (ref 31.5–35.7)
MCV: 86 fL (ref 79–97)
Platelets: 248 10*3/uL (ref 150–450)
RBC: 5.21 x10E6/uL (ref 4.14–5.80)
RDW: 14.3 % (ref 11.6–15.4)
WBC: 5.8 10*3/uL (ref 3.4–10.8)

## 2023-10-13 LAB — BASIC METABOLIC PANEL
BUN/Creatinine Ratio: 13 (ref 9–20)
BUN: 14 mg/dL (ref 6–24)
CO2: 21 mmol/L (ref 20–29)
Calcium: 9.6 mg/dL (ref 8.7–10.2)
Chloride: 102 mmol/L (ref 96–106)
Creatinine, Ser: 1.11 mg/dL (ref 0.76–1.27)
Glucose: 168 mg/dL — ABNORMAL HIGH (ref 70–99)
Potassium: 4 mmol/L (ref 3.5–5.2)
Sodium: 140 mmol/L (ref 134–144)
eGFR: 78 mL/min/{1.73_m2} (ref >59)

## 2023-10-13 LAB — HGB A1C W/O EAG: Hgb A1c MFr Bld: 7.9 % — ABNORMAL HIGH (ref 4.8–5.6)

## 2023-10-15 ENCOUNTER — Telehealth: Payer: Self-pay | Admitting: *Deleted

## 2023-10-15 NOTE — Telephone Encounter (Signed)
 Cardiac Catheterization scheduled at [facility] for: Friday October 16, 2023 7:30 AM Arrival time [facility] Main Entrance A at: 5:30 AM  Nothing to eat after midnight prior to procedure, clear liquids until 5 AM day of procedure.  Medication instructions: -Hold:  Metformin -day of procedure and 48 hours post procedure  Jardiance/Glimepiride-AM of procedure  Mounjaro-weekly on Tuesdays-pt did not take 10/13/23 and will hold until post procedure  Losartan -HCT-AM of procedure -Other usual morning medications can be taken with sips of water including aspirin  81 mg.  Plan to go home the same day, you will only stay overnight if medically necessary.  You must have responsible adult to drive you home.  Someone must be with you the first 24 hours after you arrive home.  Reviewed procedure instructions with patient.

## 2023-10-16 ENCOUNTER — Encounter (HOSPITAL_COMMUNITY)
Admission: RE | Disposition: A | Discharge status: Home / Self Care | Payer: Self-pay | Source: Home / Self Care | Attending: Cardiology

## 2023-10-16 ENCOUNTER — Other Ambulatory Visit: Payer: Self-pay

## 2023-10-16 ENCOUNTER — Ambulatory Visit (HOSPITAL_COMMUNITY)
Admission: RE | Admit: 2023-10-16 | Discharge: 2023-10-16 | Disposition: A | Discharge status: Home / Self Care | Payer: 59 | Attending: Cardiology | Admitting: Cardiology

## 2023-10-16 DIAGNOSIS — Z7982 Long term (current) use of aspirin: Secondary | ICD-10-CM | POA: Diagnosis not present

## 2023-10-16 DIAGNOSIS — Z79899 Other long term (current) drug therapy: Secondary | ICD-10-CM | POA: Diagnosis not present

## 2023-10-16 DIAGNOSIS — I25118 Atherosclerotic heart disease of native coronary artery with other forms of angina pectoris: Secondary | ICD-10-CM | POA: Diagnosis present

## 2023-10-16 DIAGNOSIS — I10 Essential (primary) hypertension: Secondary | ICD-10-CM

## 2023-10-16 DIAGNOSIS — E785 Hyperlipidemia, unspecified: Secondary | ICD-10-CM | POA: Insufficient documentation

## 2023-10-16 DIAGNOSIS — Z833 Family history of diabetes mellitus: Secondary | ICD-10-CM | POA: Insufficient documentation

## 2023-10-16 DIAGNOSIS — R931 Abnormal findings on diagnostic imaging of heart and coronary circulation: Secondary | ICD-10-CM

## 2023-10-16 DIAGNOSIS — Z7985 Long-term (current) use of injectable non-insulin antidiabetic drugs: Secondary | ICD-10-CM | POA: Diagnosis not present

## 2023-10-16 DIAGNOSIS — R072 Precordial pain: Secondary | ICD-10-CM

## 2023-10-16 DIAGNOSIS — Z87891 Personal history of nicotine dependence: Secondary | ICD-10-CM | POA: Insufficient documentation

## 2023-10-16 DIAGNOSIS — I493 Ventricular premature depolarization: Secondary | ICD-10-CM | POA: Diagnosis not present

## 2023-10-16 DIAGNOSIS — Z01812 Encounter for preprocedural laboratory examination: Secondary | ICD-10-CM

## 2023-10-16 DIAGNOSIS — Z7984 Long term (current) use of oral hypoglycemic drugs: Secondary | ICD-10-CM | POA: Diagnosis not present

## 2023-10-16 DIAGNOSIS — E119 Type 2 diabetes mellitus without complications: Secondary | ICD-10-CM | POA: Insufficient documentation

## 2023-10-16 DIAGNOSIS — Z8249 Family history of ischemic heart disease and other diseases of the circulatory system: Secondary | ICD-10-CM | POA: Insufficient documentation

## 2023-10-16 HISTORY — PX: LEFT HEART CATH AND CORONARY ANGIOGRAPHY: CATH118249

## 2023-10-16 LAB — GLUCOSE, CAPILLARY
Glucose-Capillary: 183 mg/dL — ABNORMAL HIGH (ref 70–99)
Glucose-Capillary: 144 mg/dL — ABNORMAL HIGH (ref 70–99)

## 2023-10-16 SURGERY — LEFT HEART CATH AND CORONARY ANGIOGRAPHY
Anesthesia: LOCAL

## 2023-10-16 MED ORDER — MIDAZOLAM HCL 2 MG/2ML IJ SOLN
INTRAMUSCULAR | Status: DC | PRN
  Administered 2023-10-16: 1 mg via INTRAVENOUS

## 2023-10-16 MED ORDER — FENTANYL CITRATE (PF) 100 MCG/2ML IJ SOLN
INTRAMUSCULAR | Status: AC
  Filled 2023-10-16: qty 2

## 2023-10-16 MED ORDER — HEPARIN (PORCINE) IN NACL 1000-0.9 UT/500ML-% IV SOLN
INTRAVENOUS | Status: DC | PRN
  Administered 2023-10-16 (×2): 500 mL

## 2023-10-16 MED ORDER — LIDOCAINE HCL (PF) 1 % IJ SOLN
INTRAMUSCULAR | Status: DC | PRN
  Administered 2023-10-16: 2 mL

## 2023-10-16 MED ORDER — SODIUM CHLORIDE 0.9 % WEIGHT BASED INFUSION: 3.0000 mL/kg/h | INTRAVENOUS | Status: AC

## 2023-10-16 MED ORDER — HEPARIN SODIUM (PORCINE) 1000 UNIT/ML IJ SOLN
INTRAMUSCULAR | Status: DC | PRN
  Administered 2023-10-16: 4500 [IU] via INTRAVENOUS

## 2023-10-16 MED ORDER — METFORMIN HCL ER 500 MG PO TB24: 1000.0000 mg | ORAL_TABLET | Freq: Two times a day (BID) | ORAL | Status: AC

## 2023-10-16 MED ORDER — SODIUM CHLORIDE 0.9 % WEIGHT BASED INFUSION: 1.0000 mL/kg/h | INTRAVENOUS | Status: DC

## 2023-10-16 MED ORDER — HEPARIN SODIUM (PORCINE) 1000 UNIT/ML IJ SOLN
INTRAMUSCULAR | Status: AC
  Filled 2023-10-16: qty 10

## 2023-10-16 MED ORDER — ASPIRIN 81 MG PO CHEW: 81.0000 mg | CHEWABLE_TABLET | ORAL | Status: DC

## 2023-10-16 MED ORDER — VERAPAMIL HCL 2.5 MG/ML IV SOLN
INTRAVENOUS | Status: AC
  Filled 2023-10-16: qty 2

## 2023-10-16 MED ORDER — LIDOCAINE HCL (PF) 1 % IJ SOLN
INTRAMUSCULAR | Status: AC
  Filled 2023-10-16: qty 30

## 2023-10-16 MED ORDER — VERAPAMIL HCL 2.5 MG/ML IV SOLN
INTRAVENOUS | Status: DC | PRN
  Administered 2023-10-16: 10 mL via INTRA_ARTERIAL

## 2023-10-16 MED ORDER — MIDAZOLAM HCL 2 MG/2ML IJ SOLN
INTRAMUSCULAR | Status: AC
  Filled 2023-10-16: qty 2

## 2023-10-16 MED ORDER — FENTANYL CITRATE (PF) 100 MCG/2ML IJ SOLN
INTRAMUSCULAR | Status: DC | PRN
  Administered 2023-10-16: 25 ug via INTRAVENOUS

## 2023-10-16 SURGICAL SUPPLY — 7 items
CATH 5FR JL3.5 JR4 ANG PIG MP (CATHETERS) IMPLANT
DEVICE RAD COMP TR BAND LRG (VASCULAR PRODUCTS) IMPLANT
GLIDESHEATH SLEND SS 6F .021 (SHEATH) IMPLANT
GUIDEWIRE INQWIRE 1.5J.035X260 (WIRE) IMPLANT
INQWIRE 1.5J .035X260CM (WIRE) ×1
PACK CARDIAC CATHETERIZATION (CUSTOM PROCEDURE TRAY) ×1 IMPLANT
SET ATX-X65L (MISCELLANEOUS) IMPLANT

## 2023-10-16 NOTE — Discharge Instructions (Signed)
 Radial Site Care  This sheet gives you information about how to care for yourself after your procedure. Your health care provider may also give you more specific instructions. If you have problems or questions, contact your health care provider. What can I expect after the procedure? After the procedure, it is common to have: Bruising and tenderness at the catheter insertion area. Follow these instructions at home: Medicines Take over-the-counter and prescription medicines only as told by your health care provider. Insertion site care Follow instructions from your health care provider about how to take care of your insertion site. Make sure you: Wash your hands with soap and water before you remove your bandage (dressing). If soap and water are not available, use hand sanitizer. May remove dressing in 24 hours. Check your insertion site every day for signs of infection. Check for: Redness, swelling, or pain. Fluid or blood. Pus or a bad smell. Warmth. Do no take baths, swim, or use a hot tub for 5 days. You may shower 24-48 hours after the procedure. Remove the dressing and gently wash the site with plain soap and water. Pat the area dry with a clean towel. Do not rub the site. That could cause bleeding. Do not apply powder or lotion to the site. Activity  For 24 hours after the procedure, or as directed by your health care provider: Do not flex or bend the affected arm. Do not push or pull heavy objects with the affected arm. Do not drive yourself home from the hospital or clinic. You may drive 24 hours after the procedure. Do not operate machinery or power tools. KEEP ARM ELEVATED THE REMAINDER OF THE DAY. Do not push, pull or lift anything that is heavier than 10 lb for 5 days. Ask your health care provider when it is okay to: Return to work or school. Resume usual physical activities or sports. Resume sexual activity. General instructions If the catheter site starts to  bleed, raise your arm and put firm pressure on the site. If the bleeding does not stop, get help right away. This is a medical emergency. DRINK PLENTY OF FLUIDS FOR THE NEXT 2-3 DAYS. No alcohol consumption for 24 hours after receiving sedation. If you went home on the same day as your procedure, a responsible adult should be with you for the first 24 hours after you arrive home. Keep all follow-up visits as told by your health care provider. This is important. Contact a health care provider if: You have a fever. You have redness, swelling, or yellow drainage around your insertion site. Get help right away if: You have unusual pain at the radial site. The catheter insertion area swells very fast. The insertion area is bleeding, and the bleeding does not stop when you hold steady pressure on the area. Your arm or hand becomes pale, cool, tingly, or numb. These symptoms may represent a serious problem that is an emergency. Do not wait to see if the symptoms will go away. Get medical help right away. Call your local emergency services (911 in the [place] Do not drive yourself to the hospital. Summary After the procedure, it is common to have bruising and tenderness at the site. Follow instructions from your health care provider about how to take care of your radial site wound. Check the wound every day for signs of infection.  This information is not intended to replace advice given to you by your health care provider. Make sure you discuss any questions you have with  your health care provider. Document Revised: 10/28/2017 Document Reviewed: 10/28/2017 Elsevier Patient Education  2020 [employer].

## 2023-10-16 NOTE — Interval H&P Note (Signed)
 History and Physical Interval Note:  10/16/2023 7:28 AM  Raymond Sandoval  has presented today for surgery, with the diagnosis of abnormal ct.  The various methods of treatment have been discussed with the patient and family. After consideration of risks, benefits and other options for treatment, the patient has consented to  Procedure(s): LEFT HEART CATH AND CORONARY ANGIOGRAPHY (N/A) as a surgical intervention.  The patient's history has been reviewed, patient examined, no change in status, stable for surgery.  I have reviewed the patient's chart and labs.  Questions were answered to the patient's satisfaction.   Cath Lab Visit (complete for each Cath Lab visit)  Clinical Evaluation Leading to the Procedure:   ACS: No.  Non-ACS:    Anginal Classification: CCS I  Anti-ischemic medical therapy: Maximal Therapy (2 or more classes of medications)  Non-Invasive Test Results: Intermediate-risk stress test findings: cardiac mortality 1-3%/year  Prior CABG: No previous CABG        [facility] 10/16/2023 7:28 AM

## 2023-10-18 ENCOUNTER — Encounter (HOSPITAL_COMMUNITY): Payer: Self-pay | Admitting: Cardiology

## 2023-10-26 ENCOUNTER — Ambulatory Visit: Payer: 59 | Attending: Cardiovascular Disease | Admitting: Pharmacist

## 2023-10-26 VITALS — BP 136/78 | HR 61

## 2023-10-26 DIAGNOSIS — I10 Essential (primary) hypertension: Secondary | ICD-10-CM

## 2023-10-26 DIAGNOSIS — I2510 Atherosclerotic heart disease of native coronary artery without angina pectoris: Secondary | ICD-10-CM | POA: Insufficient documentation

## 2023-10-26 MED ORDER — VALSARTAN-HYDROCHLOROTHIAZIDE 320-25 MG PO TABS: 1.0000 | ORAL_TABLET | Freq: Every day | ORAL | 3 refills | Status: DC

## 2023-10-26 NOTE — Assessment & Plan Note (Addendum)
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 clinic I have asked patient to bring in his home blood pressure machine and log for validation

## 2023-10-26 NOTE — Patient Instructions (Signed)
Your blood pressure goal is < 130/25mmHg   STOP losartan/hydrochlorothiazide  START Valsartan/hydrochlorothiazide 320/25mg  daily  Please decrease your caffeine intake  Increase exercise  Please bring your home blood pressure machine and list of readings to your next appointment  Important lifestyle changes to control high blood pressure  Intervention  Effect on the BP   Weight loss Weight loss is one of the most effective lifestyle changes for controlling blood pressure. If you're overweight or obese, losing even a small amount of weight can help reduce blood pressure.    Blood pressure can decrease by 1 millimeter of mercury (mmHg) with each kilogram (about 2.2 pounds) of weight lost.   Exercise regularly As a general goal, aim for 30 minutes of moderate physical activity every day.    Regular physical activity can lower blood pressure by 5 - 8 mmHg.   Eat a healthy diet Eat a diet rich in whole grains, fruits, vegetables, lean meat, and low-fat dairy products. Limit processed foods, saturated fat, and sweets.    A heart-healthy diet can lower high blood pressure by 10 mmHg.   Reduce salt (sodium) in your diet Aim for 000mg  of sodium each day. Avoid deli meats, canned food, and frozen microwave meals which are high in sodium.     Limiting sodium can reduce blood pressure by 5 mmHg.   Limit alcohol One drink equals 12 ounces of beer, 5 ounces of wine, or 1.5 ounces of 80-proof liquor.    Limiting alcohol to < 1 drink a day for women or < 2 drinks a day for men can help lower blood pressure by about 4 mmHg.   To check your pressure at home you will need to:   Sit up in a chair, with feet flat on the floor and back supported. Do not cross your ankles or legs. Rest your left arm so that the cuff is about heart level. If the cuff goes on your upper arm, then just relax your arm on the table, arm of the chair, or your lap. If you have a wrist cuff, hold your wrist against  your chest at heart level. Place the cuff snugly around your arm, about 1 inch above the crease of your elbow. The cords should be inside the groove of your elbow.  Sit quietly, with the cuff in place, for about 5 minutes. Then press the power button to start a reading. Do not talk or move while the reading is taking place.  Record your readings on a sheet of paper. Although most cuffs have a memory, it is often easier to see a pattern developing when the numbers are all in front of you.  You can repeat the reading after 1-3 minutes if it is recommended.   Make sure your bladder is empty and you have not had caffeine or tobacco within the last 30 minutes   Always bring your blood pressure log with you to your appointments. If you have not brought your monitor in to be double checked for accuracy, please bring it to your next appointment.   You can find a list of validated (accurate) blood pressure cuffs at: validatebp.org

## 2023-10-26 NOTE — Progress Notes (Unsigned)
Cardiology Office Note:    Date:  10/27/2023  ID:  Raymond Sandoval, DOB 1966/07/17, MRN [id number] PCP: Mliss Sax, MD  [facility] Providers Cardiologist:  Elder Negus, MD       Patient Profile:      Coronary artery disease  TTE 12/06/2018: Mild concentric LVH, EF 55, mild LAE, mild MR CCTA 09/21/2023: CAC score 1747 (99th percentile); TPV 1477 mm (97th percentile-calcified plaque 313 mm, noncalcified plaque 1164 mm), TPV extensive LHC 10/16/2023: EF 55-65; LAD proximal 50, distal 90/80, D1 50; LCx ostial 80; RCA proximal 70, distal 90 >> Med Rx (refer for CABG if progressive sx's) Hypertension  Hyperlipidemia  Diabetes mellitus  PVCs  Monitor 08/2019: 19.6% PVCs          History of Present Illness:  Discussed the use of AI scribe software for clinical note transcription with the patient, who gave verbal consent to proceed.  Raymond Sandoval is a 58 y.o. male who returns for follow-up of CAD.  He was last seen in November 2024.  ETT was arranged for CAD screening, renewal of CDL license.  This was markedly abnormal and coronary CTA was arranged.  This demonstrated potentially hemodynamically significant stenoses.  Cardiac catheterization was arranged.  This demonstrated three-vessel CAD.  Given lack of symptoms, medical therapy was recommended.  It was felt that if he developed progressive angina, he would need referral for CABG. He is here alone. He reports no chest symptoms such as pain, pressure, tightness, or heaviness. The patient denies experiencing shortness of breath, except for a cough associated with a recent flu-like illness. The patient reported no difficulty breathing when lying flat and denied any leg swelling or pain in the jaw, arm, or back during exertion. 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 The patient's job involves lifting up to 75 pounds. Prior to the cardiac catheterization, the patient did not  experience any difficulties with these physical demands.     ROS-See HPI    Studies Reviewed:            Risk Assessment/Calculations:             Physical Exam:   VS:  BP 128/80   Pulse 66   Ht 5\' 11"  (1.803 m)   Wt 191 lb 9.6 oz (86.9 kg)   SpO2 97%   BMI 26.72 kg/m    Wt Readings from Last 3 Encounters:  10/27/23 191 lb 9.6 oz (86.9 kg)  10/16/23 195 lb (88.5 kg)  09/25/23 197 lb 6.4 oz (89.5 kg)    Constitutional:      Appearance: Healthy appearance. Not in distress.  Neck:     Vascular: JVD normal.  Pulmonary:     Breath sounds: No wheezing. No rales.  Cardiovascular:     Normal rate. Regular rhythm.     Murmurs: There is no murmur.     Comments: R wrist without hematoma Edema:    Peripheral edema absent.  Abdominal:     Palpations: Abdomen is soft.      Assessment and Plan:   Assessment & Plan Coronary artery disease involving native coronary artery of native heart without angina pectoris 3 vessel CAD noted on recent cath. Recommendation is to manage this medically unless he develops refractory symptoms. He is currently doing well w/o chest discomfort or shortness of breath to suggest angina. We discussed the warning symptoms that should prompt earlier follow up.  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 This has been managed with beta-blocker (Metoprolol succinate). His EF has remained normal.  Pure hypercholesterolemia Goal LDL < 55. -Continue Atorvastatin 40 mg once daily, Fenofibrate 54 mg once daily  -CMET, Lipids, Lp(a) in 6 weeks Type 2 diabetes  mellitus with other circulatory complication, without long-term current use of insulin (HCC) Continue follow up with endocrinology for management.         Dispo:  Return in about 4 months (around 02/24/2024) for Routine Follow Up, w/ Dr. Rosemary Holms.  Signed, Tereso Newcomer, PA-C

## 2023-10-26 NOTE — Progress Notes (Signed)
Patient ID: Raymond Sandoval                 DOB: [birth year]                      MRN: [id number]      HPI: Raymond Sandoval is a 58 y.o. male referred by Dr. Rosemary Holms to HTN clinic. PMH is significant for HTN, HLD, DM, CAD (3 vessel disease). Recent cardiac CT scan with FFR analysis suggested stenosis in the left circumflex and right coronary artery, with values of 0.59 and less than 0.5 respectively. The patient's calcium score was 1747, placing him in the 99th percentile. He underwent LHC with no intervention.  Patient presents today to Pharm.D. clinic to discuss his blood pressure.  Home blood pressures running about 125-130s/70s to 80s.  Tolerating his medications well. Does admit to drinking up a large amount of caffeine daily.  Denies any dizziness, lightheadedness, shortness of breath or swelling.  Takes hydralazine between 5 and 7 AM at noon and between his 5 and 7 PM.  Has not done as much exercise lately due to his 10 pound limit from his cath but plans to increase this as long as he is approved at visit tomorrow with Tereso Newcomer.   Current HTN meds: Amlodipine 10 mg daily, hydralazine 50 mg 3 times a day, losartan hydrochlorothiazide 100/25 mg daily, metoprolol succinate 25 mg daily Previously tried: Lisinopril (cough) BP goal: <130/80  Family History: The patient has a family history of cardiac disease, with both parents having heart problems. The patient's mother had a heart attack at an early age, and his father underwent a triple bypass at the age of 30, in addition to having diabetes   Social History: Quit smoking several years ago, rare ETOH  Diet: coffee 3-4 per day, diet dr pepper, coke coke Tries not to salt food, but does eat out   Exercise:  Walks dog, some stretches, hasn't been doing weights since cath  Home BP readings: 134-125/74-84 HR 70's   Wt Readings from Last 3 Encounters:  10/16/23 195 lb (88.5 kg)  09/25/23 197 lb 6.4 oz (89.5 kg)  08/20/23 191 lb  12.8 oz (87 kg)   BP Readings from Last 3 Encounters:  10/26/23 136/78  10/16/23 112/73  09/25/23 130/84   Pulse Readings from Last 3 Encounters:  10/26/23 61  10/16/23 61  09/25/23 63    Renal function: Estimated Creatinine Clearance: 79.1 mL/min (by C-G formula based on SCr of 1.11 mg/dL).  Past Medical History:  Diagnosis Date   Appendicitis    Diabetes (HCC) 2006   Hyperlipidemia    Hypertension     Current Outpatient Medications on File Prior to Visit  Medication Sig Dispense Refill   amLODipine (NORVASC) 10 MG tablet Take 1 tablet (10 mg total) by mouth daily. 90 tablet 3   aspirin EC 81 MG tablet Take 1 tablet (81 mg total) by mouth daily. Swallow whole.     atorvastatin (LIPITOR) 40 MG tablet Take 1 tablet (40 mg total) by mouth daily. 90 tablet 3   Cholecalciferol (VITAMIN D3 MAXIMUM STRENGTH) 125 MCG (5000 UT) capsule Take 5,000 Units by mouth daily.     Continuous Glucose Sensor (FREESTYLE LIBRE 3 SENSOR) MISC See admin instructions.     fenofibrate 54 MG tablet Take 54 mg by mouth daily.     glimepiride (AMARYL) 2 MG tablet Take 2 mg by mouth daily.     hydrALAZINE (  APRESOLINE) 50 MG tablet TAKE 1 TABLET BY MOUTH 3 TIMES  DAILY 270 tablet 3   JARDIANCE 25 MG TABS tablet Take 25 mg by mouth daily.     metFORMIN (GLUCOPHAGE-XR) 500 MG 24 hr tablet Take 2 tablets (1,000 mg total) by mouth 2 (two) times daily with a meal.     metoprolol succinate (TOPROL-XL) 25 MG 24 hr tablet Take 1 tablet (25 mg total) by mouth daily. 30 tablet 3   tadalafil (CIALIS) 5 MG tablet TAKE 1 TABLET BY MOUTH ONCE DAILY AS NEEDED FOR ERECTILE DYSFUNCTION 12 tablet 3   tirzepatide (MOUNJARO) 5 MG/0.5ML Pen Inject 5 mg into the skin every Tuesday.     No current facility-administered medications on file prior to visit.    Allergies  Allergen Reactions   Codeine Hives, Nausea Only and Rash   Penicillin V Potassium Anaphylaxis   Penicillins Anaphylaxis   Hydrocodone Hives, Nausea Only  and Rash    Blood pressure 136/78, pulse 61.   Assessment/Plan:     1. 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 clinic I have asked patient to bring in his home blood pressure machine and log for validation   Thank you  Olene Floss, Pharm.D, BCACP, CPP [facility] A Division of [facility] [address], [place], [place] [address]  Phone: [phone]; Fax: [phone]

## 2023-10-27 ENCOUNTER — Encounter: Payer: Self-pay | Admitting: Physician Assistant

## 2023-10-27 ENCOUNTER — Other Ambulatory Visit: Payer: Self-pay

## 2023-10-27 ENCOUNTER — Ambulatory Visit: Payer: 59 | Attending: Physician Assistant | Admitting: Physician Assistant

## 2023-10-27 VITALS — BP 128/80 | HR 66 | Ht 71.0 in | Wt 191.6 lb

## 2023-10-27 DIAGNOSIS — I493 Ventricular premature depolarization: Secondary | ICD-10-CM

## 2023-10-27 DIAGNOSIS — E1159 Type 2 diabetes mellitus with other circulatory complications: Secondary | ICD-10-CM

## 2023-10-27 DIAGNOSIS — I10 Essential (primary) hypertension: Secondary | ICD-10-CM

## 2023-10-27 DIAGNOSIS — E7800 Pure hypercholesterolemia, unspecified: Secondary | ICD-10-CM

## 2023-10-27 DIAGNOSIS — I2510 Atherosclerotic heart disease of native coronary artery without angina pectoris: Secondary | ICD-10-CM

## 2023-10-27 NOTE — Assessment & Plan Note (Signed)
Hx of 19.6% burden on prior monitoring. This has been managed with beta-blocker (Metoprolol succinate). His EF has remained normal.

## 2023-10-27 NOTE — Assessment & Plan Note (Signed)
BP is controlled.  -Continue Amlodipine 10 mg once daily, Hydralazine 50 mg three times a day, Metoprolol succinate 25 mg once daily, Valsartan/hydrochlorothiazide 320/25 mg qd

## 2023-10-27 NOTE — Assessment & Plan Note (Signed)
3 vessel CAD noted on recent cath. Recommendation is to manage this medically unless he develops refractory symptoms. He is currently doing well w/o chest discomfort or shortness of breath to suggest angina. We discussed the warning symptoms that should prompt earlier follow up.  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

## 2023-10-27 NOTE — Assessment & Plan Note (Signed)
Continue follow up with endocrinology for management.

## 2023-10-27 NOTE — Assessment & Plan Note (Signed)
Goal LDL < 55. -Continue Atorvastatin 40 mg once daily, Fenofibrate 54 mg once daily  -CMET, Lipids, Lp(a) in 6 weeks

## 2023-10-27 NOTE — Patient Instructions (Addendum)
Medication Instructions:  Your physician recommends that you continue on your current medications as directed. Please refer to the Current Medication list given to you today.  *If you need a refill on your cardiac medications before your next appointment, please call your pharmacy*   Lab Work: Your physician recommends that you return for lab work in 6 weeks CMET, Fasting Lipid panel, Lpa   Testing/Procedures: NONE ordered at this time of appointment   Follow-Up: At [facility], you and your health needs are our priority.  As part of our continuing mission to provide you with exceptional heart care, we have created designated Provider Care Teams.  These Care Teams include your primary Cardiologist (physician) and Advanced Practice Providers (APPs -  Physician Assistants and Nurse Practitioners) who all work together to provide you with the care you need, when you need it.  We recommend signing up for the patient portal called "MyChart".  Sign up information is provided on this After Visit Summary.  MyChart is used to connect with patients for Virtual Visits (Telemedicine).  Patients are able to view lab/test results, encounter notes, upcoming appointments, etc.  Non-urgent messages can be sent to your provider as well.   To learn more about what you can do with MyChart, go to [email or web address].    Your next appointment:   4 month(s)  Provider:   Elder Negus, MD

## 2023-11-25 ENCOUNTER — Other Ambulatory Visit: Payer: Self-pay | Admitting: Internal Medicine

## 2023-12-08 LAB — COMPREHENSIVE METABOLIC PANEL
ALT: 39 IU/L (ref 0–44)
AST: 23 IU/L (ref 0–40)
Albumin: 4.6 g/dL (ref 3.8–4.9)
Alkaline Phosphatase: 70 IU/L (ref 44–121)
BUN/Creatinine Ratio: 19 (ref 9–20)
BUN: 20 mg/dL (ref 6–24)
Bilirubin Total: 0.3 mg/dL (ref 0.0–1.2)
CO2: 21 mmol/L (ref 20–29)
Calcium: 9.4 mg/dL (ref 8.7–10.2)
Chloride: 102 mmol/L (ref 96–106)
Creatinine, Ser: 1.03 mg/dL (ref 0.76–1.27)
Globulin, Total: 2.3 g/dL (ref 1.5–4.5)
Glucose: 152 mg/dL — ABNORMAL HIGH (ref 70–99)
Potassium: 4.3 mmol/L (ref 3.5–5.2)
Sodium: 140 mmol/L (ref 134–144)
Total Protein: 6.9 g/dL (ref 6.0–8.5)
eGFR: 85 mL/min/{1.73_m2} (ref >59)

## 2023-12-08 LAB — LIPID PANEL
Chol/HDL Ratio: 3.9 ratio (ref 0.0–5.0)
Cholesterol, Total: 139 mg/dL (ref 100–199)
HDL: 36 mg/dL — ABNORMAL LOW (ref >39)
LDL Chol Calc (NIH): 78 mg/dL (ref 0–99)
Triglycerides: 142 mg/dL (ref 0–149)
VLDL Cholesterol Cal: 25 mg/dL (ref 5–40)

## 2023-12-09 LAB — LIPOPROTEIN A (LPA): Lipoprotein (a): 138.7 nmol/L — ABNORMAL HIGH (ref <75.0)

## 2023-12-10 ENCOUNTER — Ambulatory Visit: Payer: 59 | Attending: Cardiovascular Disease | Admitting: Pharmacist

## 2023-12-10 ENCOUNTER — Telehealth: Payer: Self-pay | Admitting: Pharmacy Technician

## 2023-12-10 ENCOUNTER — Other Ambulatory Visit (HOSPITAL_COMMUNITY): Payer: Self-pay

## 2023-12-10 VITALS — BP 138/92 | HR 72

## 2023-12-10 DIAGNOSIS — I10 Essential (primary) hypertension: Secondary | ICD-10-CM | POA: Diagnosis not present

## 2023-12-10 DIAGNOSIS — E782 Mixed hyperlipidemia: Secondary | ICD-10-CM

## 2023-12-10 DIAGNOSIS — I2510 Atherosclerotic heart disease of native coronary artery without angina pectoris: Secondary | ICD-10-CM

## 2023-12-10 NOTE — Assessment & Plan Note (Signed)
 Assessment: LDL-C is above goal of less than 55 due to three-vessel disease and diabetes Currently on atorvastatin 40 daily Discussed medication options with patient including increasing atorvastatin, adding ezetimibe or adding Repatha And Repatha will be the most effective option, and the only option that should for sure get him to goal. We also discussed his elevated LP(a), and that Repatha would have the most effect on this as well I did discuss potentially participating in a clinical trial for LP(a) however at this time patient not interested Patient is concerned about cost of Repatha however he does have commercial insurance and should be able to use a co-pay card.  It does appear as though he has a deductible but unclear if this is on his prescription as well or just medical.  We should know more once medication is approved and test claim run as far as what the cost might be.  Plan: Will submit prior authorization for Repatha Repeat labs in 2 to 3 months If patient unable to afford Repatha would recommend ezetimibe instead

## 2023-12-10 NOTE — Assessment & Plan Note (Signed)
 Assessment: Blood pressure in clinic today is above goal however patient has had very limited sleep in the last couple days. The home blood pressures that he reports sound as though they are at goal He is increasing his physical activity Has decreased his caffeine intake significantly  Plan: Continue amlodipine 10 mg daily, hydralazine 50 mg 3 times a day, valsartan/HCTZ 320/25 mg daily , metoprolol succinate 25 mg daily Follow-up in 7 weeks.  I have asked patient to bring in his list of readings and his blood pressure cuff to this visit

## 2023-12-10 NOTE — Telephone Encounter (Signed)
 Ran test claim for repatha. For a 28 day supply and the co-pay is 24.99 . PA is not needed at this time and nothing saying this is a transition fill. This test claim was processed through [facility]- copay amounts may vary at other pharmacies due to pharmacy/plan contracts, or as the patient moves through the different stages of their insurance plan.    ===View-only below this line=== ----- Message ----- From: Olene Floss, RPH-CPP Sent: 12/10/2023  10:38 AM EST To: Rx Prior Auth Team  Please do PA for Repatha ASCVD three-vessel disease, on atorvastatin 40 mg daily.  Goal LDL-C less than 55

## 2023-12-10 NOTE — Progress Notes (Signed)
 Patient ID: Raymond Sandoval                 DOB: Dec 03, 1965                      MRN: [id number]      HPI: Raymond Sandoval is a 58 y.o. male referred by Dr. Rosemary Holms to HTN clinic. PMH is significant for HTN, HLD, DM, CAD (3 vessel disease). Recent cardiac CT scan with FFR analysis suggested stenosis in the left circumflex and right coronary artery, with values of 0.59 and less than 0.5 respectively. The patient's calcium score was 1747, placing him in the 99th percentile. He underwent LHC with no intervention.  Patient last seen in hypertension clinic on 10/26/2023.  Blood pressure was slightly above goal.  His losartan/HCTZ was switched to valsartan/HCTZ 320/25 mg daily.  He was asked to decrease his caffeine intake to 2/day and increase his exercise once cleared.  His BMP on 12/08/2023 was stable and within normal limits minus elevated blood sugar.  Patient presents today for follow-up.  Reports that he feels good.  Denies dizziness, lightheadedness, shortness of breath or swelling.  Patient is very tired as he has worked a lot in the last 2 days.  He worked overnight the previous night then got home and went to sleep at 10 AM and had to be back yesterday at 3 PM.  He worked until 3 PM this morning and then slept in his car for a few hours before his appointment today.  States he does not normally work this much.  He did not bring in a list of blood pressure readings but states that it is typically 117-130/70-80.  He has started walking with his wife several miles a few times a week.  He also plans to increase his activity.  We discussed his most recent lipid panel which showed an LDL-C of 78 and an LP(a) of 138.  We discussed medication options including increasing atorvastatin, however this would not got him to goal of less than 55.  We also discussed adding ezetimibe which will get him fairly close to or we could add Repatha which should get him to goal and will be the most effective option.   Patient concerned about cost but is willing to do the injections.  He does have Nurse, learning disability and should be able to use a co-pay card.  He was given information on how to get a co-pay card.  Injection technique and side effects were reviewed.   Current HTN meds: Amlodipine 10 mg daily, hydralazine 50 mg 3 times a day, valsartan/HCTZ 320/25 mg daily , metoprolol succinate 25 mg daily Previously tried: Lisinopril (cough) BP goal: <130/80  Family History: The patient has a family history of cardiac disease, with both parents having heart problems. The patient's mother had a heart attack at an early age, and his father underwent a triple bypass at the age of 77, in addition to having diabetes   Social History: Quit smoking several years ago, rare ETOH  Diet: coffee 3-4 per day, diet dr pepper, coke coke Tries not to salt food, but does eat out   Exercise:  Walks dog, some stretches, hasn't been doing weights since cath, has been walking more 1-4 miles (3-4 times per week)  Home BP readings: 120-130/60-70's   Wt Readings from Last 3 Encounters:  10/27/23 191 lb 9.6 oz (86.9 kg)  10/16/23 195 lb (88.5 kg)  09/25/23 197 lb 6.4  oz (89.5 kg)   BP Readings from Last 3 Encounters:  12/10/23 (!) 138/92  10/27/23 128/80  10/26/23 136/78   Pulse Readings from Last 3 Encounters:  12/10/23 72  10/27/23 66  10/26/23 61    Renal function: CrCl cannot be calculated (Unknown ideal weight.).  Past Medical History:  Diagnosis Date   Appendicitis    Diabetes (HCC) 2006   Hyperlipidemia    Hypertension     Current Outpatient Medications on File Prior to Visit  Medication Sig Dispense Refill   amLODipine (NORVASC) 10 MG tablet Take 1 tablet (10 mg total) by mouth daily. 90 tablet 3   aspirin EC 81 MG tablet Take 1 tablet (81 mg total) by mouth daily. Swallow whole.     atorvastatin (LIPITOR) 40 MG tablet Take 1 tablet (40 mg total) by mouth daily. 90 tablet 3   Cholecalciferol  (VITAMIN D3 MAXIMUM STRENGTH) 125 MCG (5000 UT) capsule Take 5,000 Units by mouth daily.     Continuous Glucose Sensor (FREESTYLE LIBRE 3 SENSOR) MISC See admin instructions.     fenofibrate 54 MG tablet Take 54 mg by mouth daily.     glimepiride (AMARYL) 2 MG tablet Take 2 mg by mouth daily.     hydrALAZINE (APRESOLINE) 50 MG tablet TAKE 1 TABLET BY MOUTH 3 TIMES  DAILY 270 tablet 3   JARDIANCE 25 MG TABS tablet Take 25 mg by mouth daily.     metFORMIN (GLUCOPHAGE-XR) 500 MG 24 hr tablet Take 2 tablets (1,000 mg total) by mouth 2 (two) times daily with a meal.     metoprolol succinate (TOPROL-XL) 25 MG 24 hr tablet Take 1 tablet (25 mg total) by mouth daily. 30 tablet 3   tadalafil (CIALIS) 5 MG tablet TAKE 1 TABLET BY MOUTH ONCE DAILY AS NEEDED FOR ERECTILE DYSFUNCTION 12 tablet 3   tirzepatide (MOUNJARO) 5 MG/0.5ML Pen Inject 5 mg into the skin every Tuesday.     valsartan-hydrochlorothiazide (DIOVAN-HCT) 320-25 MG tablet Take 1 tablet by mouth daily. Stop losartan/hctz 90 tablet 3   No current facility-administered medications on file prior to visit.    Allergies  Allergen Reactions   Codeine Hives, Nausea Only and Rash   Penicillin V Potassium Anaphylaxis   Penicillins Anaphylaxis   Hydrocodone Hives, Nausea Only and Rash    Blood pressure (!) 138/92, pulse 72.   Assessment/Plan: HYPERTENSION CONTROL Vitals:   12/10/23 0939 12/10/23 0944  BP: (!) 144/88 (!) 138/92    The patient's blood pressure is elevated above target today.  In order to address the patient's elevated BP: Blood pressure will be monitored at home to determine if medication changes need to be made.      1. Hypertension -  Essential hypertension Assessment: Blood pressure in clinic today is above goal however patient has had very limited sleep in the last couple days. The home blood pressures that he reports sound as though they are at goal He is increasing his physical activity Has decreased his  caffeine intake significantly  Plan: Continue amlodipine 10 mg daily, hydralazine 50 mg 3 times a day, valsartan/HCTZ 320/25 mg daily , metoprolol succinate 25 mg daily Follow-up in 7 weeks.  I have asked patient to bring in his list of readings and his blood pressure cuff to this visit  Mixed hyperlipidemia Assessment: LDL-C is above goal of less than 55 due to three-vessel disease and diabetes Currently on atorvastatin 40 daily Discussed medication options with patient including increasing atorvastatin, adding ezetimibe  or adding Repatha And Repatha will be the most effective option, and the only option that should for sure get him to goal. We also discussed his elevated LP(a), and that Repatha would have the most effect on this as well I did discuss potentially participating in a clinical trial for LP(a) however at this time patient not interested Patient is concerned about cost of Repatha however he does have commercial insurance and should be able to use a co-pay card.  It does appear as though he has a deductible but unclear if this is on his prescription as well or just medical.  We should know more once medication is approved and test claim run as far as what the cost might be.  Plan: Will submit prior authorization for Repatha Repeat labs in 2 to 3 months If patient unable to afford Repatha would recommend ezetimibe instead    Thank you  Olene Floss, Pharm.D, BCACP, CPP [facility] A Division of [facility] [address] [place], [place] [address]  Phone: [phone]; Fax: [phone]

## 2023-12-10 NOTE — Patient Instructions (Addendum)
 Continue Amlodipine 10 mg daily, hydralazine 50 mg 3 times a day, valsartan/HCTZ 320/25 mg daily , metoprolol succinate 25 mg daily  Continue to monitor blood pressure at home  I will submit a prior authorization for Repatha. I will call you once I hear back. Please call me at [phone] with any questions.   Repatha is a cholesterol medication that improved your body's ability to get rid of "bad cholesterol" known as LDL. It can lower your LDL up to 60%! It is an injection that is given under the skin every 2 weeks. The medication often requires a prior authorization from your insurance company. We will take care of submitting all the necessary information to your insurance company to get it approved. The most common side effects of Repatha include runny nose, symptoms of the common cold, rarely flu or flu-like symptoms, back/muscle pain in about 3-4% of the patients, and redness, pain, or bruising at the injection site. Tell your healthcare provider if you have any side effect that bothers you or that does not go away.

## 2023-12-11 ENCOUNTER — Telehealth: Payer: Self-pay | Admitting: *Deleted

## 2023-12-11 ENCOUNTER — Other Ambulatory Visit (HOSPITAL_COMMUNITY): Payer: Self-pay

## 2023-12-11 DIAGNOSIS — E782 Mixed hyperlipidemia: Secondary | ICD-10-CM

## 2023-12-11 MED ORDER — ATORVASTATIN CALCIUM 80 MG PO TABS
80.0000 mg | ORAL_TABLET | Freq: Every day | ORAL | 3 refills | Status: DC
  Filled 2023-12-11: qty 30, 30d supply, fill #0

## 2023-12-11 MED ORDER — REPATHA SURECLICK 140 MG/ML ~~LOC~~ SOAJ: 1.0000 mL | SUBCUTANEOUS | 11 refills | Status: DC

## 2023-12-11 NOTE — Telephone Encounter (Signed)
 Patient made aware of approval. He will get copay card. He will get repeat labs when he see Dr. Rosemary Holms in May.

## 2023-12-11 NOTE — Addendum Note (Signed)
 Addended by: Malena Peer D on: 12/11/2023 08:20 AM   Modules accepted: Orders

## 2023-12-11 NOTE — Telephone Encounter (Signed)
-----   Message from Nurse Corky Crafts sent at 12/11/2023  8:50 AM EST -----  ----- Message ----- From: Kennon Rounds Sent: 12/09/2023   4:48 PM EST To: [address] Triage  Result note sent to [name] via MyChart. See comments below. PLAN:  -Increase Atorvastatin to 80 mg once daily  -Fasting Lipids, ALT at follow up OV in May 2025.  Raymond Sandoval  Your LDL cholesterol remains above goal.  Your lipoprotein a level is also elevated.  Your creatinine (kidney function), potassium, liver enzymes (AST, ALT) are normal.  I will increase your atorvastatin to 80 mg a day and recheck labs when you follow-up with Dr. Rosemary Holms in May. Tereso Newcomer, PA-C    12/09/2023 4:45 PM

## 2024-01-28 ENCOUNTER — Ambulatory Visit: Admitting: Pharmacist

## 2024-02-23 ENCOUNTER — Ambulatory Visit: Payer: 59 | Attending: Cardiology | Admitting: Cardiology

## 2024-02-23 ENCOUNTER — Encounter: Payer: Self-pay | Admitting: Cardiology

## 2024-02-23 VITALS — BP 134/88 | HR 62 | Resp 16 | Ht 71.0 in | Wt 194.0 lb

## 2024-02-23 DIAGNOSIS — I10 Essential (primary) hypertension: Secondary | ICD-10-CM

## 2024-02-23 DIAGNOSIS — E1165 Type 2 diabetes mellitus with hyperglycemia: Secondary | ICD-10-CM

## 2024-02-23 DIAGNOSIS — I2510 Atherosclerotic heart disease of native coronary artery without angina pectoris: Secondary | ICD-10-CM

## 2024-02-23 DIAGNOSIS — E782 Mixed hyperlipidemia: Secondary | ICD-10-CM | POA: Diagnosis not present

## 2024-02-23 DIAGNOSIS — Z8249 Family history of ischemic heart disease and other diseases of the circulatory system: Secondary | ICD-10-CM | POA: Insufficient documentation

## 2024-02-23 DIAGNOSIS — E7841 Elevated Lipoprotein(a): Secondary | ICD-10-CM | POA: Insufficient documentation

## 2024-02-23 NOTE — Progress Notes (Addendum)
 Cardiology Office Note:  .   Date:  02/23/2024  ID:  Raymond Sandoval, DOB [birth year], MRN [id number] PCP: Tonna Frederic, MD  [facility] Providers Cardiologist:  Fransico Ivy, MD PCP: Tonna Frederic, MD  Chief Complaint  Patient presents with   Frequent PVCs   Follow-up   Hypertension     Raymond Sandoval is a 58 y.o. male with hypertension, hyperlipidemia, and LP(a), type II DM, former smoker, family history of early CAD multivessel CAD (cath 10/2023)  History of Present Illness  Patient is a Naval architect.  I last saw him in 08/2023.  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  Exercise stress test showed horizontal ST depressions in V5, V6 after 11.8 METS on treadmill.  Given abnormal treadmill stress test, he underwent coronary CT angiogram, which showed 99th percentile coronary calcium  score, and high total plaque volume, along with severe stenoses in RCA, moderate stenosis in proximal LAD and proximal left circumflex, of which left circumflex gnosis was flow-limiting, LAD was not.  Coronary angiogram was subsequently performed by Dr. [place], that showed 50% proximal-mid LAD, and diffuse 80-90% distal LAD disease and a small caliber vessel, along with 80% proximal left circumflex, 70% proximal RCA, and 90% distal RCA stenoses.  LVEF was preserved.  Given relative lack of symptomatology, continued medical management was recommended, with possibility of CABG in future if he were to have anginal symptoms.  Patient is here for follow-up today.  He continues to work as a Naval architect without any difficulty.  He walks with his wife on weekends for 3-4 miles without any chest pain, shortness of breath or any other symptoms.  He is compliant with his medical therapy.  He does endorse recent dietary indiscretion on his vacation.    Vitals:   02/23/24 0802  BP: 134/88  Pulse: 62   Resp: 16  SpO2: 99%      Review of Systems  Cardiovascular:  Negative for chest pain, dyspnea on exertion, leg swelling, palpitations and syncope.        Studies Reviewed: Aaron Aas        Labs: 12-08-2023: Chol 139, TG 142, HDL 36, LDL 78 HbA1C 7.8% Hb 14.8 Cr 1.0  Coronary angiography 10/2023:   Prox LAD to Mid LAD lesion is 50% stenosed.   Dist LAD-1 lesion is 90% stenosed.   Dist LAD-2 lesion is 80% stenosed.   1st Diag lesion is 50% stenosed.   Ost Cx to Mid Cx lesion is 80% stenosed.   Prox RCA lesion is 70% stenosed.   Dist RCA lesion is 90% stenosed.   The left ventricular systolic function is normal.   LV end diastolic pressure is normal.   The left ventricular ejection fraction is 55-65% by visual estimate.   3 vessel obstructive CAD Normal LV function Mildly elevated LVEDP   Plan: given lack of significant symptomatology would recommend continued medical therapy. If symptoms progress may need to consider CABG    Physical Exam Vitals and nursing note reviewed.  Constitutional:      General: He is not in acute distress. Neck:     Vascular: No JVD.  Cardiovascular:     Rate and Rhythm: Normal rate and regular rhythm.     Heart sounds: Normal heart sounds. No murmur heard. Pulmonary:     Effort: Pulmonary effort is normal.     Breath sounds: Normal breath sounds. No  wheezing or rales.  Musculoskeletal:     Right lower leg: No edema.     Left lower leg: No edema.      VISIT DIAGNOSES:   ICD-10-CM   1. Coronary artery disease involving native coronary artery of native heart without angina pectoris  I25.10 Lipid panel    ECHOCARDIOGRAM COMPLETE    Lipid panel    2. Mixed hyperlipidemia  E78.2 Lipid panel    Lipid panel    3. Uncontrolled type 2 diabetes mellitus with hyperglycemia (HCC)  E11.65     4. Essential hypertension  I10     5. Elevated Lp(a)  E78.41     6. Family history of early CAD  Z82.49        Raymond Sandoval is a 58 y.o. male  with hypertension, hyperlipidemia, and LP(a), type II DM, former smoker, family history of early CAD multivessel CAD (cath 10/2023)  Assessment & Plan  CAD: Severe multivessel disease, albeit without any symptoms.  Preserved LVEF on angiogram in 10/2023. He is diabetic, has elevated LP(a), and family history of early CAD. Given continued physical activity without any symptoms, I recommend medical management is still the right answer.  I reckon he fits ISCHEMIA trial (severe CAD with abnormal stress test but no symptoms, therefore continue medical therapy).  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  If we choose for revascularization at some point, I am skeptical if LAD would be a good target for surgical revascularization or not. At this time, continue aspirin  81 mg daily, Lipitor 80 mg daily, along with Repatha . LDL was 78 in 12/2023 before starting Repatha .  Will recheck lipid panel in June. Continue metoprolol . I will obtain formal echocardiogram to establish his baseline EF. Given continued complete lack of symptoms, he is okay to continue his work as a Naval architect.  Hypertension: Controlled metoprolol , valsartan  hydrochlorothiazide , amlodipine .  Type 2 diabetes mellitus: Uncontrolled, A1c 7.8% in March.  He has regular follow-up with endocrinology and has had recent changes to his medication, particularly increasing dose of Mounjaro.  He is also on metformin  and glimepiride.  Mixed hyperlipidemia, elevated Lp(a): Goal LDL <55.  Continue Lipitor and Repatha .  Check lipid panel in June.     F/u in 6 months  Signed, Cody Das, MD

## 2024-02-23 NOTE — Patient Instructions (Signed)
 Lab Work: Lipid panel to be completed 1st week of June 2025  If you have labs (blood work) drawn today and your tests are completely normal, you will receive your results only by: MyChart Message (if you have MyChart) OR A paper copy in the mail If you have any lab test that is abnormal or we need to change your treatment, we will call you to review the results.  Testing/Procedures: Echo  Your physician has requested that you have an echocardiogram. Echocardiography is a painless test that uses sound waves to create images of your heart. It provides your doctor with information about the size and shape of your heart and how well your heart's chambers and valves are working. This procedure takes approximately one hour. There are no restrictions for this procedure. Please do NOT wear cologne, perfume, aftershave, or lotions (deodorant is allowed). Please arrive 15 minutes prior to your appointment time.  Please note: We ask at that you not bring children with you during ultrasound (echo/ vascular) testing. Due to room size and safety concerns, children are not allowed in the ultrasound rooms during exams. Our front office staff cannot provide observation of children in our lobby area while testing is being conducted. An adult accompanying a patient to their appointment will only be allowed in the ultrasound room at the discretion of the ultrasound technician under special circumstances. We apologize for any inconvenience.   Follow-Up: At [facility], you and your health needs are our priority.  As part of our continuing mission to provide you with exceptional heart care, our providers are all part of one team.  This team includes your primary Cardiologist (physician) and Advanced Practice Providers or APPs (Physician Assistants and Nurse Practitioners) who all work together to provide you with the care you need, when you need it.  Your next appointment:   6 month(s)  Provider:    Cody Das, MD

## 2024-03-09 ENCOUNTER — Ambulatory Visit: Payer: Self-pay | Admitting: Cardiology

## 2024-03-09 DIAGNOSIS — I2510 Atherosclerotic heart disease of native coronary artery without angina pectoris: Secondary | ICD-10-CM

## 2024-03-09 LAB — LIPID PANEL
Chol/HDL Ratio: 1.8 ratio (ref 0.0–5.0)
Cholesterol, Total: 75 mg/dL — ABNORMAL LOW (ref 100–199)
HDL: 42 mg/dL (ref >39)
LDL Chol Calc (NIH): 14 mg/dL (ref 0–99)
Triglycerides: 102 mg/dL (ref 0–149)
VLDL Cholesterol Cal: 19 mg/dL (ref 5–40)

## 2024-03-09 NOTE — Progress Notes (Signed)
 Lipids very well controlled now. We discussed Mr. Bernstein case in our team meeting.  There was concern regarding DOT requirements in patients with known coronary artery disease.  Please provide if you have any DOT paperwork or requirements.  Alternatively, we could consider repeating a stress test now that you are on optimal medical treatment.  Thanks MJP

## 2024-03-11 ENCOUNTER — Telehealth (HOSPITAL_COMMUNITY): Payer: Self-pay | Admitting: *Deleted

## 2024-03-11 ENCOUNTER — Encounter (HOSPITAL_COMMUNITY): Payer: Self-pay | Admitting: *Deleted

## 2024-03-11 NOTE — Telephone Encounter (Signed)
 Instructions for 03/31/24 stress test sent via [employer].

## 2024-03-18 ENCOUNTER — Encounter (HOSPITAL_COMMUNITY): Payer: Self-pay | Admitting: *Deleted

## 2024-03-23 ENCOUNTER — Ambulatory Visit: Attending: Cardiology | Admitting: Pharmacist

## 2024-03-23 VITALS — BP 118/80 | HR 60

## 2024-03-23 DIAGNOSIS — I10 Essential (primary) hypertension: Secondary | ICD-10-CM | POA: Diagnosis not present

## 2024-03-23 DIAGNOSIS — E782 Mixed hyperlipidemia: Secondary | ICD-10-CM

## 2024-03-23 MED ORDER — ATORVASTATIN CALCIUM 40 MG PO TABS: 40.0000 mg | ORAL_TABLET | Freq: Every day | ORAL | 3 refills | Status: AC

## 2024-03-23 NOTE — Progress Notes (Signed)
 Patient ID: Raymond Sandoval                 DOB: 06/29/1966                      MRN: [id number]      HPI: Raymond Sandoval is a 58 y.o. male referred by Dr. Filiberto Hug to HTN clinic. PMH is significant for HTN, HLD, DM, CAD (3 vessel disease). Recent cardiac CT scan with FFR analysis suggested stenosis in the left circumflex and right coronary artery, with values of 0.59 and less than 0.5 respectively. The patient's calcium  score was 1747, placing him in the 99th percentile. He underwent LHC with no intervention.  Patient last seen in hypertension clinic on 10/26/2023.  Blood pressure was slightly above goal.  His losartan /HCTZ was switched to valsartan /HCTZ 320/25 mg daily.  He was asked to decrease his caffeine intake to 2/day and increase his exercise once cleared.  His BMP on 12/08/2023 was stable and within normal limits minus elevated blood sugar.  I saw him in clinic 12/10/2023.  Blood pressure in clinic was 138/92, however patient had a few days of lack of sleep and his home readings he was reporting were at goal.  Therefore no changes were made to his blood pressure regimen.  He was started on Repatha  for his cholesterol.  Repeat lipid panel 6/325 showed an LDL-C of 14.  Patient saw Dr. Filiberto Hug on 02/23/24.  Blood pressure in clinic was 134/88.  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 Home readings ranging from low 130's/70-low 80's. Majority of readings at goal. Home cuff found to be accurate. Does get lightheaded only when he stands up too fast. He is taking his hydralazine  between 5-8AM, noon and 5 PM. I have asked him to take his last dose at bedtime instead of 5PM.  Home OMRON BP cuff 126/91 119/86 Clinic 118/80   Current HTN meds: Amlodipine  10 mg daily, hydralazine  50 mg 3 times a day, valsartan /HCTZ 320/25 mg daily , metoprolol  succinate 25 mg daily Previously tried: Lisinopril  (cough) BP goal: <130/80  Family History: The patient has a family  history of cardiac disease, with both parents having heart problems. The patient's mother had a heart attack at an early age, and his father underwent a triple bypass at the age of 63, in addition to having diabetes   Social History: Quit smoking several years ago, rare ETOH  Diet: coffee 3-4 per day, diet dr pepper, coke coke Tries not to salt food, but does eat out   Exercise:  Walks dog, some stretches, hasn't been doing weights since cath, has been walking more 1-4 miles (3-4 times per week)  Home BP readings:  135/74 126/81 125/74 134/74 123/72 128/80 135/76 121/77 119/77 136/87 135/83 123/79   Wt Readings from Last 3 Encounters:  02/23/24 194 lb (88 kg)  10/27/23 191 lb 9.6 oz (86.9 kg)  10/16/23 195 lb (88.5 kg)   BP Readings from Last 3 Encounters:  03/23/24 118/80  02/23/24 134/88  12/10/23 (!) 138/92   Pulse Readings from Last 3 Encounters:  03/23/24 60  02/23/24 62  12/10/23 72    Renal function: CrCl cannot be calculated (Patient's most recent lab result is older than the maximum 21 days allowed.).  Past Medical History:  Diagnosis Date   Appendicitis    Diabetes (HCC) 2006   Hyperlipidemia    Hypertension     Current Outpatient Medications  on File Prior to Visit  Medication Sig Dispense Refill   amLODipine  (NORVASC ) 10 MG tablet Take 1 tablet (10 mg total) by mouth daily. 90 tablet 3   Evolocumab  (REPATHA  SURECLICK) 140 MG/ML SOAJ Inject 140 mg into the skin every 14 (fourteen) days. 2 mL 11   fenofibrate  54 MG tablet Take 54 mg by mouth daily.     glimepiride (AMARYL) 2 MG tablet Take 2 mg by mouth daily.     hydrALAZINE  (APRESOLINE ) 50 MG tablet TAKE 1 TABLET BY MOUTH 3 TIMES  DAILY 270 tablet 3   JARDIANCE 25 MG TABS tablet Take 25 mg by mouth daily.     metFORMIN  (GLUCOPHAGE -XR) 500 MG 24 hr tablet Take 2 tablets (1,000 mg total) by mouth 2 (two) times daily with a meal.     metoprolol  succinate (TOPROL -XL) 25 MG 24 hr tablet Take 1 tablet  (25 mg total) by mouth daily. 30 tablet 3   tirzepatide (MOUNJARO) 7.5 MG/0.5ML Pen Inject 5 mg into the skin every Tuesday.     valsartan -hydrochlorothiazide  (DIOVAN -HCT) 320-25 MG tablet Take 1 tablet by mouth daily. Stop losartan /hctz 90 tablet 3   aspirin  EC 81 MG tablet Take 1 tablet (81 mg total) by mouth daily. Swallow whole.     Cholecalciferol (VITAMIN D3 MAXIMUM STRENGTH) 125 MCG (5000 UT) capsule Take 5,000 Units by mouth daily.     Continuous Glucose Sensor (FREESTYLE LIBRE 3 SENSOR) MISC See admin instructions.     tadalafil  (CIALIS ) 5 MG tablet TAKE 1 TABLET BY MOUTH ONCE DAILY AS NEEDED FOR ERECTILE DYSFUNCTION 12 tablet 3   No current facility-administered medications on file prior to visit.    Allergies  Allergen Reactions   Codeine Hives, Nausea Only and Rash   Penicillin V Potassium Anaphylaxis   Penicillins Anaphylaxis   Hydrocodone Hives, Nausea Only and Rash    Blood pressure 118/80, pulse 60.   Assessment/Plan:     1. Hypertension -  Mixed hyperlipidemia Assessment:  LDL-C 14 on atorvastatin  80mg  and Repatha  No issues with Repatha   Plan: Decrease atorvastatin  to 40mg  daily Continue Repatha   Essential hypertension Assessment: BP in clinic 118/80 Home readings at goal the majority of the time Dizziness only if he stands up too fast Tolerating medications Walking 4-5 miles on the weekends. Did encourage some weight training especially since he is on Mounjaro.  Knows he should cut back on caffeine (diet dr pepper)  Plan: Continue amlodipine  10 mg daily, hydralazine  50 mg 3 times a day, valsartan /HCTZ 320/25 mg daily , metoprolol  succinate 25 mg daily Follow up as needed   Thank you  Cashel Bellina D Elenore Wanninger, Pharm.D, BCACP, CPP [facility] A Division [facility] [address], [place], [place] [address]  Phone: [phone]; Fax: [phone]

## 2024-03-23 NOTE — Assessment & Plan Note (Signed)
 Assessment:  LDL-C 14 on atorvastatin  80mg  and Repatha  No issues with Repatha   Plan: Decrease atorvastatin  to 40mg  daily Continue Repatha 

## 2024-03-23 NOTE — Patient Instructions (Addendum)
 You can decrease atorvastatin  to 40mg  daily. Continue Repatha  and fenofibrate   Continue amlodipine  10 mg daily, hydralazine  50 mg 3 times a day, valsartan /HCTZ 320/25 mg daily , metoprolol  succinate 25 mg daily  Take hydralazine  in the AM, mid day and then at bedtime   Please call me at [phone] with any questions or problems

## 2024-03-23 NOTE — Assessment & Plan Note (Signed)
 Assessment: BP in clinic 118/80 Home readings at goal the majority of the time Dizziness only if he stands up too fast Tolerating medications Walking 4-5 miles on the weekends. Did encourage some weight training especially since he is on Mounjaro.  Knows he should cut back on caffeine (diet dr pepper)  Plan: Continue amlodipine  10 mg daily, hydralazine  50 mg 3 times a day, valsartan /HCTZ 320/25 mg daily , metoprolol  succinate 25 mg daily Follow up as needed

## 2024-03-28 ENCOUNTER — Other Ambulatory Visit: Payer: Self-pay | Admitting: Cardiology

## 2024-03-28 DIAGNOSIS — I2510 Atherosclerotic heart disease of native coronary artery without angina pectoris: Secondary | ICD-10-CM

## 2024-03-29 ENCOUNTER — Other Ambulatory Visit: Payer: Self-pay | Admitting: Pharmacist

## 2024-03-29 MED ORDER — REPATHA SURECLICK 140 MG/ML ~~LOC~~ SOAJ: 1.0000 mL | SUBCUTANEOUS | 3 refills | Status: AC

## 2024-03-30 ENCOUNTER — Telehealth (HOSPITAL_COMMUNITY): Payer: Self-pay | Admitting: Radiology

## 2024-03-30 NOTE — Telephone Encounter (Signed)
 Patient given detailed instructions per Myocardial Perfusion Study Information Sheet for the test on 03/31/2024 at 7:45. Patient notified to arrive 15 minutes early and that it is imperative to arrive on time for appointment to keep from having the test rescheduled.  If you need to cancel or reschedule your appointment, please call the office within 24 hours of your appointment. . Patient verbalized understanding.EHK

## 2024-03-31 ENCOUNTER — Other Ambulatory Visit (HOSPITAL_COMMUNITY)

## 2024-03-31 ENCOUNTER — Ambulatory Visit (HOSPITAL_BASED_OUTPATIENT_CLINIC_OR_DEPARTMENT_OTHER)
Admission: RE | Admit: 2024-03-31 | Discharge: 2024-03-31 | Disposition: A | Discharge status: Home / Self Care | Source: Ambulatory Visit | Attending: Cardiovascular Disease | Admitting: Cardiovascular Disease

## 2024-03-31 ENCOUNTER — Ambulatory Visit (HOSPITAL_COMMUNITY)
Admission: RE | Admit: 2024-03-31 | Discharge: 2024-03-31 | Disposition: A | Discharge status: Home / Self Care | Source: Ambulatory Visit | Attending: Cardiovascular Disease | Admitting: Cardiovascular Disease

## 2024-03-31 DIAGNOSIS — I2510 Atherosclerotic heart disease of native coronary artery without angina pectoris: Secondary | ICD-10-CM

## 2024-03-31 LAB — ECHOCARDIOGRAM COMPLETE
Area-P 1/2: 2.82 cm2
Height: 71 in
S' Lateral: 2.8 cm
Weight: 3104 [oz_av]

## 2024-03-31 LAB — MYOCARDIAL PERFUSION IMAGING
Angina Index: 0
Base ST Depression (mm): 0 mm
Duke Treadmill Score: 2
Estimated workload: 10.9
Exercise duration (min): 9 min
Exercise duration (sec): 31 s
LV dias vol: 83 mL (ref 62–150)
LV sys vol: 20 mL (ref 4.2–5.8)
MPHR: 163 {beats}/min
Nuc Stress EF: 76 %
Peak HR: 150 {beats}/min
Percent HR: 92 %
Rest HR: 60 {beats}/min
Rest Nuclear Isotope Dose: 11 mCi
SDS: 0
SRS: 13
SSS: 2
ST Depression (mm): 1.5 mm
Stress Nuclear Isotope Dose: 31 mCi
TID: 0.93

## 2024-03-31 MED ORDER — TECHNETIUM TC 99M TETROFOSMIN IV KIT
11.0000 | PACK | Freq: Once | INTRAVENOUS | Status: AC | PRN
  Administered 2024-03-31: 11 via INTRAVENOUS

## 2024-03-31 MED ORDER — TECHNETIUM TC 99M TETROFOSMIN IV KIT
31.0000 | PACK | Freq: Once | INTRAVENOUS | Status: AC | PRN
  Administered 2024-03-31: 31 via INTRAVENOUS

## 2024-04-01 ENCOUNTER — Ambulatory Visit: Payer: Self-pay | Admitting: Cardiology

## 2024-04-01 NOTE — Progress Notes (Signed)
 Stress test showed certain abnormalities. We need review DOT requirements. Please schedule an appt to discuss this with the patient.  Thanks MJP

## 2024-04-12 ENCOUNTER — Ambulatory Visit: Attending: Cardiology | Admitting: Cardiology

## 2024-04-12 ENCOUNTER — Encounter: Payer: Self-pay | Admitting: Cardiology

## 2024-04-12 VITALS — BP 145/86 | HR 69 | Ht 71.0 in | Wt 192.4 lb

## 2024-04-12 DIAGNOSIS — I493 Ventricular premature depolarization: Secondary | ICD-10-CM

## 2024-04-12 DIAGNOSIS — E7800 Pure hypercholesterolemia, unspecified: Secondary | ICD-10-CM

## 2024-04-12 DIAGNOSIS — I2510 Atherosclerotic heart disease of native coronary artery without angina pectoris: Secondary | ICD-10-CM

## 2024-04-12 DIAGNOSIS — E7841 Elevated Lipoprotein(a): Secondary | ICD-10-CM

## 2024-04-12 NOTE — Patient Instructions (Signed)
 Medication Instructions:  STOP Fenofibrate    *If you need a refill on your cardiac medications before your next appointment, please call your pharmacy*  Lab Work: Lipid panel 09/2024  If you have labs (blood work) drawn today and your tests are completely normal, you will receive your results only by: MyChart Message (if you have MyChart) OR A paper copy in the mail If you have any lab test that is abnormal or we need to change your treatment, we will call you to review the results.    Follow-Up: At [facility], you and your health needs are our priority.  As part of our continuing mission to provide you with exceptional heart care, our providers are all part of one team.  This team includes your primary Cardiologist (physician) and Advanced Practice Providers or APPs (Physician Assistants and Nurse Practitioners) who all work together to provide you with the care you need, when you need it.  Referral to Cardiothoracic Surgery   Your next appointment:   6 month(s)  Provider:   Newman JINNY Lawrence, MD

## 2024-04-12 NOTE — Progress Notes (Signed)
 Cardiology Office Note:  .   Date:  04/12/2024  ID:  [name], DOB December 06, 1965, MRN [id number] PCP: Berneta Elsie Sayre, MD  [facility] Providers Cardiologist:  Newman Lawrence, MD PCP: Berneta Elsie Sayre, MD  Chief Complaint  Patient presents with   Hypertension   Coronary Artery Disease     PINCHUS WECKWERTH is a 58 y.o. male with hypertension, hyperlipidemia, and LP(a), type II DM, former smoker, multivessel CAD, family history of early CAD   History of Present Illness  Patient is here with his wife today.  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  However, exercises treadmill stress test and CT scan showed findings suggestive of coronary artery disease that led to coronary angiogram in 10/2023 that showed severe multivessel coronary artery disease, albeit without any significant symptoms.  However, given his high risk profession, we discussed this in heart team meeting.  I thought to get more information with updated exercise nuclear imaging stress testing that showed ischemia in RCA and left circumflex territory, and possibly in distal LAD territory.  Patient did report exertional dyspnea on stress test and admitted that he does not do that level of brisk and uphill walking generally.    His blood pressure is elevated today, generally very well-controlled.  He is on excellent medical therapy including antianginal therapy, as well as lipid-lowering therapy including Repatha .  Lipids are very well-controlled.   Vitals:   04/12/24 1151  BP: (!) 145/86  Pulse: 69  SpO2: 97%      Review of Systems  Cardiovascular:  Negative for chest pain, dyspnea on exertion, leg swelling, palpitations and syncope.        Studies Reviewed: SABRA        EKG 04/12/2024: Sinus rhythm with marked sinus arrhythmia Inferior infarct (cited on or before 25-Sep-2023) When compared with ECG  of 25-Sep-2023 13:08, Nonspecific T wave abnormality now evident in Lateral leads    Labs: 12/2023: Chol 139, TG 142, HDL 36, LDL 78 HbA1C 7.8% Hb 14.8 Cr 1.0   Stress test 03/2024:   Patient exercised for 9 minutes 31 seconds, achieved 92% of age-predicted maximum heart rate, and 10.9 METS.   Stress ECG positive for ischemia in the inferolateral leads, in recovery rare PVCs, 1 ventricular run, and one 4 beat run of NSVT.  ST changes resolved within 3 minutes into recovery.   Small sized, moderate intensity, reversible perfusion defect in the apical inferior segment suggestive of ischemia in the distal RCA.   Medium size, mild intensity reversible perfusion defect involving the apical anterior, apical lateral, and apex.   No obvious evidence of prior infarct.   LVEF 76%, visually preserved, no obvious regional wall motion normalities, and LV cavity size normal.   Coronary calcium  was present on the attenuation correction CT images. Severe coronary calcifications were present. Coronary calcifications were present in the left anterior descending artery, left circumflex artery and right coronary artery distribution(s).   Summary: Based on stress ECG findings, reversible perfusion defect as mentioned above, severe coronary artery calcification, at least intermediate risk study.  Clinical correlation required.      Coronary angiography 10/2023:   Prox LAD to Mid LAD lesion is 50% stenosed.   Dist LAD-1 lesion is 90% stenosed.   Dist LAD-2 lesion is 80% stenosed.   1st Diag lesion is 50% stenosed.   Ost Cx to Mid Cx lesion is  80% stenosed.   Prox RCA lesion is 70% stenosed.   Dist RCA lesion is 90% stenosed.   The left ventricular systolic function is normal.   LV end diastolic pressure is normal.   The left ventricular ejection fraction is 55-65% by visual estimate.   3 vessel obstructive CAD Normal LV function Mildly elevated LVEDP   Plan: given lack of significant symptomatology  would recommend continued medical therapy. If symptoms progress may need to consider CABG    Physical Exam Vitals and nursing note reviewed.  Constitutional:      General: He is not in acute distress. Neck:     Vascular: No JVD.  Cardiovascular:     Rate and Rhythm: Normal rate and regular rhythm.     Heart sounds: Normal heart sounds. No murmur heard. Pulmonary:     Effort: Pulmonary effort is normal.     Breath sounds: Normal breath sounds. No wheezing or rales.  Musculoskeletal:     Right lower leg: No edema.     Left lower leg: No edema.      VISIT DIAGNOSES:   ICD-10-CM   1. Frequent PVCs  I49.3 EKG 12-Lead       GODFREY TRITSCHLER is a 58 y.o. male with hypertension, hyperlipidemia, and LP(a), type II DM, former smoker, multivessel CAD, family history of early CAD   Assessment & Plan  CAD: Severe multivessel disease, with significant ischemia noted on exercise nuclear stress testing.   While he does not have any symptoms with his day-to-day activity, he did have exertional dyspnea on exercise nuclear suggesting that associated with ischemia as noted above. He is a Hydrographic surveyor.  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  That said, I would defer to medical certifier regarding specific DOT requirements and will be happy to share my notes with them.  As I explained to patient and his wife, in addition to optimal medical management, survival benefit is largely in patients who could successfully get a LIMA-LAD graft in the setting of his severe multivessel disease and diabetes.  I remain unsure if his LAD has good targets, although both left circumflex and RCA are treatable with percutaneous or surgical intervention.  Successful CABG would be his best option to reduce uncertainties regarding his ability to continue driving as a Hydrographic surveyor. I will refer him to cardiothoracic  surgery Dr. Shyrl for formal evaluation if he would benefit from CABG. depending on Dr. Lang thoughts, he may need a repeat cardiac catheterization if Dr. Shyrl feels that LAD could be a surgical target based on his cath in 10/2023.  In the meantime, continue current medical therapy including aspirin , statin, Repatha , and antianginal therapy.  I would stop his fenofibrate  given that his triglycerides are fairly normal at this time.  Hypertension: Generally well-controlled on controlled metoprolol , valsartan  hydrochlorothiazide , amlodipine . Recommend regular home blood pressure monitoring.  If blood pressure remains >130/80 mmHg, could consider including metoprolol  succinate at 50 mg daily.  Type 2 diabetes mellitus: Uncontrolled, A1c 7.8% in March.  He has regular follow-up with endocrinology and has had recent changes to his medication, particularly increasing dose of Mounjaro.  He is also on metformin  and glimepiride.  Mixed hyperlipidemia, elevated Lp(a): Goal LDL <55.  Continue Lipitor and Repatha .    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 Shyrl, and documenting in the encounter.   Signed, Newman JINNY Lawrence, MD

## 2024-04-22 ENCOUNTER — Ambulatory Visit
Attending: Thoracic Surgery (Cardiothoracic Vascular Surgery) | Admitting: Thoracic Surgery (Cardiothoracic Vascular Surgery)

## 2024-04-22 VITALS — BP 145/73 | HR 64 | Resp 20 | Ht 71.0 in | Wt 190.6 lb

## 2024-04-22 DIAGNOSIS — I2510 Atherosclerotic heart disease of native coronary artery without angina pectoris: Secondary | ICD-10-CM | POA: Diagnosis not present

## 2024-04-22 NOTE — Progress Notes (Signed)
 [address]       [place] [address]             [phone]        Raymond Sandoval [facility] Medical Record #[id number] Date of Birth: August 20, 1966  Referring: Raymond Sandoval PARAS, MD Primary Care: Raymond Elsie Sayre, MD Primary Cardiologist:Manish PARAS [place], MD  Chief Complaint:    Chief Complaint  Patient presents with   Coronary Artery Disease    New patient consultation, Myocardial CT 04/02/24, ECHO 03/31/24, CATH 1/10    History of Present Illness:     Raymond Sandoval is a 58 y.o. male who presents for surgical evaluation of three-vessel coronary artery disease.  He has been followed for over a year after initially having some PVCs.  Since being on beta-blockers he has not had any more episodes.  In November of last year he underwent a stress test which was low risk, and subsequently underwent a left heart catheterization in January which identified the disease.  He recently underwent another stress test which showed intermediate risk.  He remains asymptomatic and is quite active.  He recently went hiking with his wife and did not have any symptoms.  He also walks 5 miles on a regular basis with his wife pushing the stroller uphill and also does not have any symptoms.   Past Medical and Surgical History: Previous Chest Surgery: No Previous Chest Radiation: No Diabetes Mellitus: Yes.  HbA1C 7.9 Creatinine:  Lab Results  Component Value Date   CREATININE 1.03 12/08/2023   CREATININE 1.11 10/12/2023   CREATININE 0.97 09/18/2023     Past Medical History:  Diagnosis Date   Appendicitis    Diabetes (HCC) 2006   Hyperlipidemia    Hypertension     Past Surgical History:  Procedure Laterality Date   APPENDECTOMY  1975   LEFT HEART CATH AND CORONARY ANGIOGRAPHY N/A 10/16/2023   Procedure: LEFT HEART CATH AND CORONARY ANGIOGRAPHY;  Surgeon: Swaz[place], Peter M, MD;  Location: [facility] INVASIVE CV LAB;  Service: Cardiovascular;  Laterality: N/A;    VASECTOMY     WISDOM TOOTH EXTRACTION      Social History:  Social History   Tobacco Use  Smoking Status Former   Current packs/day: 0.00   Average packs/day: 0.1 packs/day for 12.0 years (1.2 ttl pk-yrs)   Types: Cigarettes   Start date: 32   Quit date: 43   Years since quitting: 35.5  Smokeless Tobacco Former   Types: Chew  Tobacco Comments   Social Only    Social History   Substance and Sexual Activity  Alcohol Use Yes   Alcohol/week: 0.0 standard drinks of alcohol   Comment: rare     Allergies  Allergen Reactions   Codeine Hives, Nausea Only and Rash   Penicillin V Potassium Anaphylaxis   Penicillins Anaphylaxis   Hydrocodone Hives, Nausea Only and Rash    Medications:   Current Outpatient Medications  Medication Sig Dispense Refill   amLODipine  (NORVASC ) 10 MG tablet Take 1 tablet (10 mg total) by mouth daily. 90 tablet 3   aspirin  EC 81 MG tablet Take 1 tablet (81 mg total) by mouth daily. Swallow whole.     atorvastatin  (LIPITOR) 40 MG tablet Take 1 tablet (40 mg total) by mouth daily. 90 tablet 3   Cholecalciferol (VITAMIN D3 MAXIMUM STRENGTH) 125 MCG (5000 UT) capsule Take 5,000 Units by mouth daily.     Continuous Glucose Sensor (FREESTYLE [place]  3 SENSOR) MISC See admin instructions.     Evolocumab  (REPATHA  SURECLICK) 140 MG/ML SOAJ Inject 140 mg into the skin every 14 (fourteen) days. 6 mL 3   glimepiride (AMARYL) 2 MG tablet Take 2 mg by mouth daily.     hydrALAZINE  (APRESOLINE ) 50 MG tablet TAKE 1 TABLET BY MOUTH 3 TIMES  DAILY 270 tablet 3   JARDIANCE 25 MG TABS tablet Take 25 mg by mouth daily.     metFORMIN  (GLUCOPHAGE -XR) 500 MG 24 hr tablet Take 2 tablets (1,000 mg total) by mouth 2 (two) times daily with a meal.     metoprolol  succinate (TOPROL -XL) 25 MG 24 hr tablet Take 1 tablet (25 mg total) by mouth daily. 30 tablet 3   tadalafil  (CIALIS ) 5 MG tablet TAKE 1 TABLET BY MOUTH ONCE DAILY AS NEEDED FOR ERECTILE DYSFUNCTION 12 tablet 3    tirzepatide (MOUNJARO) 7.5 MG/0.5ML Pen Inject 5 mg into the skin every Tuesday.     valsartan -hydrochlorothiazide  (DIOVAN -HCT) 320-25 MG tablet Take 1 tablet by mouth daily. Stop losartan /hctz 90 tablet 3   No current facility-administered medications for this visit.    (Not in a hospital admission)   Family History  Problem Relation Age of Onset   Heart attack Mother    Heart failure Mother 44       passed   Hypertension Mother    Diabetes Mother    Heart disease Mother    Arthritis Mother    Stroke Mother 74   Hypertension Father 51       Deceased   Kidney failure Father    Diabetes Father    Heart disease Father    Healthy Brother        x1   Diabetes Paternal Aunt        x2   Diabetes Paternal Grandfather    Cancer Paternal Grandfather    Healthy Son        x1   Heart disease Other        Grandparents   Heart disease Other        Paterna.l Aunts & Uncles     Review of Systems:   Review of Systems  Constitutional:  Negative for malaise/fatigue.  Respiratory:  Negative for shortness of breath.   Cardiovascular:  Negative for chest pain.  Neurological: Negative.       Physical Exam: BP (!) 145/73 (BP Location: Right Arm, Patient Position: Sitting, Cuff Size: Normal)   Pulse 64   Resp 20   Ht 5' 11 (1.803 m)   Wt 190 lb 9.6 oz (86.5 kg)   SpO2 97% Comment: RA  BMI 26.58 kg/m  Physical Exam Constitutional:      General: He is not in acute distress.    Appearance: He is not ill-appearing.  HENT:     Head: Normocephalic and atraumatic.  Eyes:     Extraocular Movements: Extraocular movements intact.  Cardiovascular:     Rate and Rhythm: Normal rate.  Pulmonary:     Effort: Pulmonary effort is normal. No respiratory distress.  Abdominal:     General: Abdomen is flat. There is no distension.  Musculoskeletal:        General: Normal range of motion.     Cervical back: Normal range of motion.  Skin:    General: Skin is warm and dry.   Neurological:     General: No focal deficit present.     Mental Status: He is alert and oriented to person, place,  and time.       Diagnostic Studies & Laboratory data:   Intervention Cardiac Studies & Procedures   ______________________________________________________________________________________________ CARDIAC CATHETERIZATION  CARDIAC CATHETERIZATION 10/16/2023  Conclusion   Prox LAD to Mid LAD lesion is 50% stenosed.   Dist LAD-1 lesion is 90% stenosed.   Dist LAD-2 lesion is 80% stenosed.   1st Diag lesion is 50% stenosed.   Ost Cx to Mid Cx lesion is 80% stenosed.   Prox RCA lesion is 70% stenosed.   Dist RCA lesion is 90% stenosed.   The left ventricular systolic function is normal.   LV end diastolic pressure is normal.   The left ventricular ejection fraction is 55-65% by visual estimate.  3 vessel obstructive CAD Normal LV function Mildly elevated LVEDP  Plan: given lack of significant symptomatology would recommend continued medical therapy. If symptoms progress may need to consider CABG  Findings Coronary Findings Diagnostic  Dominance: Right  Left Main Vessel was injected. Vessel is normal in caliber. Vessel is angiographically normal.  Left Anterior Descending Prox LAD to Mid LAD lesion is 50% stenosed. Dist LAD-1 lesion is 90% stenosed. The lesion is segmental. Dist LAD-2 lesion is 80% stenosed.  First Diagonal Branch 1st Diag lesion is 50% stenosed.  Left Circumflex Ost Cx to Mid Cx lesion is 80% stenosed.  Right Coronary Artery Prox RCA lesion is 70% stenosed. Dist RCA lesion is 90% stenosed.  Intervention  No interventions have been documented.   STRESS TESTS  MYOCARDIAL PERFUSION IMAGING 03/31/2024  Interpretation Summary   Patient exercised for 9 minutes 31 seconds, achieved 92% of age-predicted maximum heart rate, and 10.9 METS.   Stress ECG positive for ischemia in the inferolateral leads, in recovery rare PVCs, 1  ventricular run, and one 4 beat run of NSVT.  ST changes resolved within 3 minutes into recovery.   Small sized, moderate intensity, reversible perfusion defect in the apical inferior segment suggestive of ischemia in the distal RCA.   Medium size, mild intensity reversible perfusion defect involving the apical anterior, apical lateral, and apex.   No obvious evidence of prior infarct.   LVEF 76%, visually preserved, no obvious regional wall motion normalities, and LV cavity size normal.   Coronary calcium  was present on the attenuation correction CT images. Severe coronary calcifications were present. Coronary calcifications were present in the left anterior descending artery, left circumflex artery and right coronary artery distribution(s).   Summary: Based on stress ECG findings, reversible perfusion defect as mentioned above, severe coronary artery calcification, at least intermediate risk study.  Clinical correlation required.   ECHOCARDIOGRAM  ECHOCARDIOGRAM COMPLETE 03/31/2024  Narrative ECHOCARDIOGRAM REPORT    Patient Name:   LAVARIUS DOUGHTEN Date of Exam: 03/31/2024 Medical Rec #:  [id number]        Height:       71.0 in Accession #:    [id number]       Weight:       194.0 lb Date of Birth:  1966-02-27         BSA:          2.081 m Patient Age:    58 years         BP:           118/80 mmHg Patient Gender: M                HR:           61 bpm. Exam Location:  [facility]  Procedure:  2D Echo, Cardiac Doppler and Color Doppler (Both Spectral and Color Flow Doppler were utilized during procedure).  Indications:    I25.10 CAD  History:        Patient has prior history of Echocardiogram examinations, most recent 12/06/2018. CAD, Arrythmias:PVC; Risk Factors:Hypertension, Diabetes and Dyslipidemia.  Sonographer:    Elsie Bohr RDCS Referring Phys: [id number] [facility] J PATWARDHAN  IMPRESSIONS   1. Left ventricular ejection fraction, by estimation, is 60 to 65%. Left ventricular  ejection fraction by 3D volume is 63 %. The left ventricle has normal function. The left ventricle has no regional wall motion abnormalities. Left ventricular diastolic parameters were normal. The average left ventricular global longitudinal strain is -22.5 %. The global longitudinal strain is normal. 2. Right ventricular systolic function is normal. The right ventricular size is normal. 3. The mitral valve is normal in structure. No evidence of mitral valve regurgitation. No evidence of mitral stenosis. 4. The aortic valve is normal in structure. Aortic valve regurgitation is not visualized. No aortic stenosis is present. 5. The inferior vena cava is normal in size with greater than 50% respiratory variability, suggesting right atrial pressure of 3 mmHg.  FINDINGS Left Ventricle: Left ventricular ejection fraction, by estimation, is 60 to 65%. Left ventricular ejection fraction by 3D volume is 63 %. The left ventricle has normal function. The left ventricle has no regional wall motion abnormalities. The average left ventricular global longitudinal strain is -22.5 %. Strain was performed and the global longitudinal strain is normal. The left ventricular internal cavity size was normal in size. There is no left ventricular hypertrophy. Left ventricular diastolic parameters were normal.  Right Ventricle: The right ventricular size is normal. No increase in right ventricular wall thickness. Right ventricular systolic function is normal.  Left Atrium: Left atrial size was normal in size.  Right Atrium: Right atrial size was normal in size.  Pericardium: There is no evidence of pericardial effusion.  Mitral Valve: The mitral valve is normal in structure. No evidence of mitral valve regurgitation. No evidence of mitral valve stenosis.  Tricuspid Valve: The tricuspid valve is normal in structure. Tricuspid valve regurgitation is not demonstrated. No evidence of tricuspid stenosis.  Aortic Valve: The  aortic valve is normal in structure. Aortic valve regurgitation is not visualized. No aortic stenosis is present.  Pulmonic Valve: The pulmonic valve was normal in structure. Pulmonic valve regurgitation is not visualized. No evidence of pulmonic stenosis.  Aorta: The aortic root is normal in size and structure.  Venous: The inferior vena cava is normal in size with greater than 50% respiratory variability, suggesting right atrial pressure of 3 mmHg.  IAS/Shunts: No atrial level shunt detected by color flow Doppler.  Additional Comments: 3D was performed not requiring image post processing on an independent workstation and was normal.   LEFT VENTRICLE PLAX 2D LVIDd:         4.60 cm         Diastology LVIDs:         2.80 cm         LV e' medial:    9.36 cm/s LV PW:         1.10 cm         LV E/e' medial:  7.6 LV IVS:        1.10 cm         LV e' lateral:   10.90 cm/s LVOT diam:     2.30 cm  LV E/e' lateral: 6.5 LV SV:         92 LV SV Index:   44              2D Longitudinal LVOT Area:     4.15 cm        Strain 2D Strain GLS   -19.4 % (A4C): 2D Strain GLS   -22.6 % (A3C): 2D Strain GLS   -25.5 % (A2C): 2D Strain GLS   -22.5 % Avg:  3D Volume EF LV 3D EF:    Left ventricul ar ejection fraction by 3D volume is 63 %.  3D Volume EF: 3D EF:        63 % LV EDV:       137 ml LV ESV:       50 ml LV SV:        87 ml  RIGHT VENTRICLE             IVC RV S prime:     14.50 cm/s  IVC diam: 1.40 cm TAPSE (M-mode): 2.8 cm  LEFT ATRIUM             Index        RIGHT ATRIUM           Index LA diam:        3.70 cm 1.78 cm/m   RA Pressure: 3.00 mmHg LA Vol (A2C):   58.1 ml 27.91 ml/m  RA Area:     17.00 cm LA Vol (A4C):   64.6 ml 31.04 ml/m  RA Volume:   45.20 ml  21.72 ml/m LA Biplane Vol: 61.4 ml 29.50 ml/m AORTIC VALVE LVOT Vmax:   102.00 cm/s LVOT Vmean:  69.000 cm/s LVOT VTI:    0.222 m  AORTA Ao Root diam: 3.20 cm Ao Asc diam:  2.90 cm  MITRAL VALVE                TRICUSPID VALVE MV Area (PHT): 2.82 cm    Estimated RAP:  3.00 mmHg MV Decel Time: 269 msec MV E velocity: 71.30 cm/s  SHUNTS MV A velocity: 66.60 cm/s  Systemic VTI:  0.22 m MV E/A ratio:  1.07        Systemic Diam: 2.30 cm  Morene Brownie Electronically signed by Morene Brownie Signature Date/Time: 03/31/2024/1:32:05 PM    Final      CT SCANS  CT CORONARY MORPH W/CTA COR W/SCORE 09/21/2023  Addendum 10/04/2023 12:30 AM ADDENDUM REPORT: 10/04/2023 00:27  EXAM: OVER-READ INTERPRETATION  CT CHEST  The following report is an over-read performed by radiologist Dr. Oneil Devonshire of [facility] on 10/04/2023. This over-read does not include interpretation of cardiac or coronary anatomy or pathology. The coronary calcium  score/coronary CTA interpretation by the cardiologist is attached.  COMPARISON:  None.  FINDINGS: Cardiovascular: There are no significant extracardiac vascular findings.  Mediastinum/Nodes: There are no enlarged lymph nodes within the visualized mediastinum.  Lungs/Pleura: There is no pleural effusion. The visualized lungs appear clear.  Upper abdomen: Mild fatty infiltration of the liver is noted.  Musculoskeletal/Chest wall: No chest wall mass or suspicious osseous findings within the visualized chest.  IMPRESSION: No significant extracardiac findings within the visualized chest.   Electronically Signed By: Oneil Devonshire M.D. On: 10/04/2023 00:27  Narrative CLINICAL DATA:  58 yo male with abnormal ETT  EXAM: Cardiac/Coronary CTA  TECHNIQUE: A non-contrast, gated CT scan was obtained with axial slices of 3 mm through the heart for calcium   scoring. Calcium  scoring was performed using the Agatston method. A 120 kV prospective, gated, contrast cardiac scan was obtained. Gantry rotation speed was 250 msecs and collimation was 0.6 mm. Two sublingual nitroglycerin  tablets (0.8 mg) were given. The 3D data set was  reconstructed in 5% intervals of the 35-75% of the R-R cycle. Diastolic phases were analyzed on a dedicated workstation using MPR, MIP, and VRT modes. The patient received 95 cc of contrast.  FINDINGS: Image quality: Excellent.  Noise artifact is: Limited.  Coronary Arteries:  Normal coronary origin.  Right dominance.  Left main: The left main is a large caliber vessel with a normal take off from the left coronary cusp that bifurcates to form a left anterior descending artery and a left circumflex artery. There is no plaque or stenosis.  Left anterior descending artery: The LAD has mild (25-49) mixed plaque stenosis in the ostium; the proximal vessel is heavily calcified with moderate (50-69) stenosis followed by moderate (50-69) soft plaque stenosis; there is minimal (0-24) plaque in the mid vessel and mild (25-49) calcified plaque in the distal vessel. The LAD gives off one diagonal branch (diffuse, mild (25-49) disease noted.  Left circumflex artery: The LCX is non-dominant with moderate (50-69) mixed plaque stenosis in the proximal vessel. The LCX gives off 1 large branching OM with mild (25-49) disease in the mid vessel; there is mild (25-49) disease in the superior branch and moderate (50-69) stenosis in the inferior branch.  Right coronary artery: The RCA is dominant with normal take off from the right coronary cusp. There is mild (25-49) plaque in the proximal vessel; severe (70-99) mixed plaque stenosis in the mid vessel followed by mild (25-49) plaque; severe (70-99) stenosis in the distal vessel. The RCA terminates as a PDA and right posterolateral branch with mild (25-49) disease in each.  Right Atrium: Right atrial size is within normal limits.  Right Ventricle: The right ventricular cavity is within normal limits.  Left Atrium: Left atrial size is normal in size with no left atrial appendage filling defect.  Left Ventricle: The ventricular cavity size is  within normal limits.  Pulmonary arteries: Normal in size.  Pulmonary veins: Normal pulmonary venous drainage.  Pericardium: Normal thickness without significant effusion or calcium  present.  Cardiac valves: The aortic valve is trileaflet without significant calcification. The mitral valve is normal without significant calcification.  Aorta: Normal caliber without significant disease.  Extra-cardiac findings: See attached radiology report for non-cardiac structures.  IMPRESSION: 1. Coronary calcium  score of 1747. This was 102 percentile for age-, sex, and race-matched controls.  2. Total plaque volume 1477 mm3 which is 97 percentile for age- and sex-matched controls (calcified plaque 313 mm3; non-calcified plaque 1164 mm3). TPV is (extensive).  3. Normal coronary origin with right dominance.  4. Severe (70-99) stenoses in the RCA; moderate (50-69) stenoses in the proximal LAD and proximal Lcx; otherwise as outlined.  5. Aortic atherosclerosis.  6. Study will be sent for FFR.  RECOMMENDATIONS: CAD-RADS 4: Severe stenosis. (70-99% or > 50% left main). Cardiac catheterization or CT FFR is recommended. Consider symptom-guided anti-ischemic pharmacotherapy as well as risk factor modification per guideline directed care. Invasive coronary angiography recommended with revascularization per published guideline statements.  Redell Shallow, MD  Electronically Signed: By: Redell Shallow M.D. On: 09/21/2023 16:39     ______________________________________________________________________________________________     EKG: Sinus I have independently reviewed the above radiologic studies and discussed with the patient   Recent Lab Findings: Lab Results  Component  Value Date   WBC 5.8 10/12/2023   HGB 14.2 10/12/2023   HCT 45.0 10/12/2023   PLT 248 10/12/2023   GLUCOSE 152 (H) 12/08/2023   CHOL 75 (L) 03/08/2024   TRIG 102 03/08/2024   HDL 42 03/08/2024   LDLDIRECT  82.0 12/23/2021   LDLCALC 14 03/08/2024   ALT 39 12/08/2023   AST 23 12/08/2023   NA 140 12/08/2023   K 4.3 12/08/2023   CL 102 12/08/2023   CREATININE 1.03 12/08/2023   BUN 20 12/08/2023   CO2 21 12/08/2023   TSH 1.90 09/19/2019   HGBA1C 7.9 (H) 10/12/2023      Assessment / Plan:   58 y.o. male with three-vessel coronary artery disease.  He has preserved biventricular function, no significant valvular disease.  For the most part he is asymptomatic and is quite active.  He is able to walk 5 miles uphill without any symptoms.  Also he has excellent exercise tolerance on each one of his stress tests.  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  Given his lack of symptoms would like to discuss his case further in our cath conference, but I do not think that surgery is indicated.  On review of his left heart catheterization the LAD is quite small and would be difficult to graft.  He does have a good PDA and OM target.     I  spent 40 minutes counseling the patient face to face.   Linnie MALVA Rayas 04/22/2024 11:27 AM

## 2024-04-25 ENCOUNTER — Telehealth: Payer: Self-pay | Admitting: Cardiology

## 2024-04-25 NOTE — Telephone Encounter (Signed)
 Patient would like a copy of his medical records to take to another provider.

## 2024-05-05 ENCOUNTER — Ambulatory Visit
Attending: Thoracic Surgery (Cardiothoracic Vascular Surgery) | Admitting: Thoracic Surgery (Cardiothoracic Vascular Surgery)

## 2024-05-05 DIAGNOSIS — I2510 Atherosclerotic heart disease of native coronary artery without angina pectoris: Secondary | ICD-10-CM

## 2024-05-05 NOTE — Progress Notes (Signed)
     [address]       [place] [address]             [phone]       Patient: Home Provider: Office Consent for Telemedicine visit obtained.  Today's visit was completed via a real-time telehealth (see specific modality noted below). The patient/authorized person provided oral consent at the time of the visit to engage in a telemedicine encounter with the present provider at [facility]. The patient/authorized person was informed of the potential benefits, limitations, and risks of telemedicine. The patient/authorized person expressed understanding that the laws that protect confidentiality also apply to telemedicine. The patient/authorized person acknowledged understanding that telemedicine does not provide emergency services and that he or she would need to call 911 or proceed to the nearest hospital for help if such a need arose.   Total time spent in the clinical discussion 10 minutes.  Telehealth Modality: Phone visit (audio only)  I had a telephone visit with [name].  He remains asymptomatic.  He states that he just walked 4 miles yesterday without any problems.  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  He would like to follow-up after that appointment in January.  Dalylah Ramey MALVA Rayas

## 2024-05-06 ENCOUNTER — Telehealth: Admitting: Thoracic Surgery (Cardiothoracic Vascular Surgery)

## 2024-07-14 ENCOUNTER — Telehealth: Payer: Self-pay | Admitting: Cardiology

## 2024-07-14 MED ORDER — HYDRALAZINE HCL 50 MG PO TABS: 50.0000 mg | ORAL_TABLET | Freq: Three times a day (TID) | ORAL | 2 refills | Status: AC

## 2024-07-14 NOTE — Telephone Encounter (Signed)
 RX sent in

## 2024-07-14 NOTE — Telephone Encounter (Signed)
*  STAT* If patient is at the pharmacy, call can be transferred to refill team.   1. Which medications need to be refilled? (please list name of each medication and dose if known) hydrALAZINE  (APRESOLINE ) 50 MG tablet    2. Would you like to learn more about the convenience, safety, & potential cost savings by using the [facility]?    3. Are you open to using the [facility] (Type [facility].  ).   4. Which pharmacy/location (including street and city if local pharmacy) is medication to be sent to? [facility] - [place], [place] - [address]    5. Do they need a 30 day or 90 day supply? 90 day

## 2024-07-19 ENCOUNTER — Ambulatory Visit: Admitting: Family Medicine

## 2024-07-19 ENCOUNTER — Encounter: Admitting: Family Medicine

## 2024-07-29 ENCOUNTER — Encounter: Payer: Self-pay | Admitting: Family Medicine

## 2024-07-29 LAB — HM DIABETES EYE EXAM

## 2024-09-14 ENCOUNTER — Other Ambulatory Visit: Payer: Self-pay | Admitting: Cardiology

## 2024-11-04 ENCOUNTER — Telehealth: Admitting: Thoracic Surgery (Cardiothoracic Vascular Surgery)
